# Patient Record
Sex: Male | Born: 1995 | Race: White | Hispanic: No | Smoking: Former smoker
Health system: Southern US, Community
[De-identification: ages and names within clinical notes are randomized; demographics above are authoritative.]

## PROBLEM LIST (undated history)

## (undated) DIAGNOSIS — R51 Headache: Secondary | ICD-10-CM

## (undated) DIAGNOSIS — M549 Dorsalgia, unspecified: Secondary | ICD-10-CM

## (undated) HISTORY — DX: Headache: R51

---
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
## 2015-06-10 ENCOUNTER — Emergency Department (INDEPENDENT_AMBULATORY_CARE_PROVIDER_SITE_OTHER): Payer: BLUE CROSS/BLUE SHIELD

## 2015-06-10 ENCOUNTER — Emergency Department (HOSPITAL_COMMUNITY)
Admission: EM | Admit: 2015-06-10 | Discharge: 2015-06-10 | Disposition: A | Discharge status: Home / Self Care | Payer: BLUE CROSS/BLUE SHIELD | Source: Home / Self Care | Attending: Family Medicine | Admitting: Family Medicine

## 2015-06-10 DIAGNOSIS — M533 Sacrococcygeal disorders, not elsewhere classified: Secondary | ICD-10-CM | POA: Diagnosis not present

## 2015-06-10 MED ORDER — OXYCODONE-ACETAMINOPHEN 5-325 MG PO TABS: 1.0000 | ORAL_TABLET | ORAL | Status: DC | PRN

## 2015-06-10 MED ORDER — IBUPROFEN 800 MG PO TABS
ORAL_TABLET | ORAL | Status: AC
  Filled 2015-06-10: qty 1

## 2015-06-10 MED ORDER — IBUPROFEN 800 MG PO TABS
400.0000 mg | ORAL_TABLET | Freq: Once | ORAL | Status: AC
  Administered 2015-06-10: 400 mg via ORAL

## 2015-06-10 MED ORDER — KETOROLAC TROMETHAMINE 30 MG/ML IJ SOLN: 30.0000 mg | Freq: Once | INTRAMUSCULAR | Status: DC

## 2015-06-10 NOTE — Discharge Instructions (Signed)
Your xray is negative for fracture. We will treat you conservatively. Please use pain medicine as needed. Obtain donut cushion from the pharmacy to put pressure off your tail bone. See your PCP in 1 wk or sooner if no improvement.  Tailbone Injury The tailbone is the small bone at the lower end of the backbone (spine). You may have stretched tissues, bruises, or a broken bone (fracture). These injuries can be painful. Most tailbone injuries get better on their own in 4-6 weeks. HOME CARE  Take medicines only as told by your doctor.  If told, apply ice to the injured area.  Put ice in a plastic bag.  Place a towel between your skin and the bag.  Leave the ice on for 20 minutes, 2-3 times per day. Do this for the first 1-2 days.  Sit on a large, rubber or inflated ring or cushion to lessen pain. Lean forward when you sit to help lessen pain.  Avoid sitting in one place for a long time.  Increase your activity as the pain allows.  Do exercises as told by your doctor or physical therapist.  If it is painful to poop, take medicine to help you poop (stool softeners) as told by your doctor.  Eat foods that have plenty of fiber.  Keep all follow-up visits as told by your doctor. This is important. GET HELP IF:  Your pain gets worse.  Pooping causes you pain.  You cannot poop (constipation).  You are leaking pee (urinary incontinence).  You have a fever.   This information is not intended to replace advice given to you by your health care provider. Make sure you discuss any questions you have with your health care provider.   Document Released: 06/14/2010 Document Revised: 09/26/2014 Document Reviewed: 05/08/2014 Elsevier Interactive Patient Education [employer]2016 [employer]

## 2015-06-10 NOTE — ED Provider Notes (Signed)
CSN: [id number]     Arrival date & time 06/10/15  1749 History   First MD Initiated Contact with Patient 06/10/15 1858     No chief complaint on file.  (Consider location/radiation/quality/duration/timing/severity/associated sxs/prior Treatment) The history is provided by the patient. No language interpreter was used.   tail bone pain: Patient presents with tail bone pain following a fall off the truck and the ball that connects the trailer to the truck hit him on his butt.  This happened 2 days ago.Since then he has been in pain. He can't sit straight up. Pain is about 8/10 standing and more when sitting down. Now he has some swelling of his tail bone. He used Ibuprofen as needed for pain with no improvement. Her grandma gave her some pain meds which helped yesterday.  Past Medical History  Diagnosis Date  . DUKGURKY(706.2Headache(784.0)    Past Surgical History  Procedure Laterality Date  . Tympanostomy tube placement Bilateral 1998   Family History  Problem Relation Age of Onset  . Migraines Mother   . Seizures Mother   . Heart attack Maternal Grandfather     Died at 347  . Heart attack Paternal Grandfather     Died at 877   Social History  Substance Use Topics  . Smoking status: Never Smoker   . Smokeless tobacco: Never Used  . Alcohol Use: No    Review of Systems  Respiratory: Negative.   Cardiovascular: Negative.   Gastrointestinal: Negative.   Musculoskeletal:       Tail bone pain  All other systems reviewed and are negative.   Allergies  Review of patient's allergies indicates no known allergies.  Home Medications   Prior to Admission medications   Medication Sig Start Date End Date Taking? Authorizing Provider  albuterol (PROVENTIL HFA;VENTOLIN HFA) 108 (90 BASE) MCG/ACT inhaler Inhale 1 puff into the lungs every 6 (six) hours as needed for wheezing or shortness of breath.    Historical Provider, MD  levocetirizine (XYZAL) 5 MG tablet  04/04/14   Historical Provider, MD    ondansetron (ZOFRAN) 4 MG tablet Take 1 tablet (4 mg total) by mouth every 8 (eight) hours as needed for nausea or vomiting. 04/30/14   Ladona MowJoe Mintz, PA-C   Meds Ordered and Administered this Visit  Medications - No data to display  BP 114/75 mmHg  Pulse 91  Temp(Src) 98.3 F (36.8 C) (Oral)  SpO2 99% No data found.   Physical Exam  Constitutional: He appears well-developed. No distress.  Cardiovascular: Regular rhythm and normal heart sounds.   No murmur heard. Pulmonary/Chest: Effort normal and breath sounds normal. No respiratory distress. He has no wheezes.  Skin:     Nursing note and vitals reviewed.   ED Course  Procedures (including critical care time)  Labs Review Labs Reviewed - No data to display  Imaging Review No results found.   Visual Acuity Review  Right Eye Distance:   Left Eye Distance:   Bilateral Distance:    Right Eye Near:   Left Eye Near:    Bilateral Near:     Dg Sacrum/coccyx  06/10/2015  CLINICAL DATA:  Pain following fall from truck EXAM: SACRUM AND COCCYX - 2+ VIEW COMPARISON:  None. FINDINGS: Frontal and lateral views were obtained. There is no demonstrable fracture or diastases. Joint spaces appear intact. No erosive change. IMPRESSION: No fracture or diastases.  No appreciable arthropathic change. Electronically Signed   By: Bretta BangWilliam  Woodruff III M.D.   On:  06/10/2015 19:29       MDM  No diagnosis found. Coccydynia  Concern for coccygeal fracture. Xray image reviewed by me and radiologist's impression reviewed. No fracture or dislocation noted. Ibuprofen given during this visit with mild improvement of his pain. i recommended OTC donut cushion to help put weight off his tail bone. Percocet prescribed prn pain. F/U with PCP in 1 wk or sooner if pain persist.    Doreene Eland, MD 06/10/15 [phone]

## 2015-06-10 NOTE — ED Notes (Signed)
Patient states he fell off the back of a truck two days ago hitting His tail bone the hitch of the truck Has a lot of pain and can also feel a "knot" where he landed

## 2015-12-01 ENCOUNTER — Emergency Department (HOSPITAL_COMMUNITY)
Admission: EM | Admit: 2015-12-01 | Discharge: 2015-12-01 | Disposition: A | Discharge status: Home / Self Care | Payer: BLUE CROSS/BLUE SHIELD | Attending: Emergency Medicine | Admitting: Emergency Medicine

## 2015-12-01 ENCOUNTER — Encounter (HOSPITAL_COMMUNITY): Payer: Self-pay | Admitting: Emergency Medicine

## 2015-12-01 DIAGNOSIS — M549 Dorsalgia, unspecified: Secondary | ICD-10-CM | POA: Insufficient documentation

## 2015-12-01 DIAGNOSIS — R197 Diarrhea, unspecified: Secondary | ICD-10-CM | POA: Insufficient documentation

## 2015-12-01 DIAGNOSIS — R112 Nausea with vomiting, unspecified: Secondary | ICD-10-CM

## 2015-12-01 DIAGNOSIS — F17210 Nicotine dependence, cigarettes, uncomplicated: Secondary | ICD-10-CM | POA: Diagnosis not present

## 2015-12-01 HISTORY — DX: Dorsalgia, unspecified: M54.9

## 2015-12-01 LAB — COMPREHENSIVE METABOLIC PANEL
ALBUMIN: 4.6 g/dL (ref 3.5–5.0)
ALT: 20 U/L (ref 17–63)
ANION GAP: 10 (ref 5–15)
AST: 23 U/L (ref 15–41)
Alkaline Phosphatase: 75 U/L (ref 38–126)
BUN: 12 mg/dL (ref 6–20)
CALCIUM: 9.7 mg/dL (ref 8.9–10.3)
CO2: 22 mmol/L (ref 22–32)
Chloride: 106 mmol/L (ref 101–111)
Creatinine, Ser: 0.75 mg/dL (ref 0.61–1.24)
GFR calc Af Amer: >60 mL/min (ref >60)
GFR calc non Af Amer: >60 mL/min (ref >60)
GLUCOSE: 124 mg/dL — AB (ref 65–99)
Potassium: 4 mmol/L (ref 3.5–5.1)
SODIUM: 138 mmol/L (ref 135–145)
Total Bilirubin: 0.7 mg/dL (ref 0.3–1.2)
Total Protein: 7.8 g/dL (ref 6.5–8.1)

## 2015-12-01 LAB — CBC
HCT: 50 % (ref 39.0–52.0)
HEMOGLOBIN: 17.2 g/dL — AB (ref 13.0–17.0)
MCH: 30.9 pg (ref 26.0–34.0)
MCHC: 34.4 g/dL (ref 30.0–36.0)
MCV: 89.9 fL (ref 78.0–100.0)
Platelets: 278 10*3/uL (ref 150–400)
RBC: 5.56 MIL/uL (ref 4.22–5.81)
RDW: 12.7 % (ref 11.5–15.5)
WBC: 9.6 10*3/uL (ref 4.0–10.5)

## 2015-12-01 LAB — URINE MICROSCOPIC-ADD ON
RBC / HPF: NONE SEEN RBC/hpf (ref 0–5)
WBC, UA: NONE SEEN WBC/hpf (ref 0–5)

## 2015-12-01 LAB — URINALYSIS, ROUTINE W REFLEX MICROSCOPIC
GLUCOSE, UA: NEGATIVE mg/dL
HGB URINE DIPSTICK: NEGATIVE
Ketones, ur: 40 mg/dL — AB
Leukocytes, UA: NEGATIVE
Nitrite: NEGATIVE
PH: 5.5 (ref 5.0–8.0)
PROTEIN: NEGATIVE mg/dL
SPECIFIC GRAVITY, URINE: 1.033 — AB (ref 1.005–1.030)

## 2015-12-01 LAB — LIPASE, BLOOD: Lipase: 16 U/L (ref 11–51)

## 2015-12-01 MED ORDER — ONDANSETRON 4 MG PO TBDP: 4.0000 mg | ORAL_TABLET | Freq: Three times a day (TID) | ORAL | Status: AC | PRN

## 2015-12-01 MED ORDER — LOPERAMIDE HCL 2 MG PO CAPS
4.0000 mg | ORAL_CAPSULE | Freq: Once | ORAL | Status: AC
  Administered 2015-12-01: 4 mg via ORAL
  Filled 2015-12-01: qty 2

## 2015-12-01 MED ORDER — IBUPROFEN 200 MG PO TABS
600.0000 mg | ORAL_TABLET | Freq: Once | ORAL | Status: AC
  Administered 2015-12-01: 600 mg via ORAL
  Filled 2015-12-01: qty 3

## 2015-12-01 MED ORDER — ONDANSETRON HCL 4 MG/2ML IJ SOLN
4.0000 mg | Freq: Once | INTRAMUSCULAR | Status: AC
  Administered 2015-12-01: 4 mg via INTRAVENOUS
  Filled 2015-12-01: qty 2

## 2015-12-01 MED ORDER — LOPERAMIDE HCL 2 MG PO CAPS: 2.0000 mg | ORAL_CAPSULE | ORAL | Status: AC | PRN

## 2015-12-01 MED ORDER — SODIUM CHLORIDE 0.9 % IV BOLUS (SEPSIS)
1000.0000 mL | Freq: Once | INTRAVENOUS | Status: AC
  Administered 2015-12-01: 1000 mL via INTRAVENOUS

## 2015-12-01 NOTE — ED Provider Notes (Signed)
CSN: [id number]     Arrival date & time 12/01/15  [id number] History   First MD Initiated Contact with Patient 12/01/15 0440     Chief Complaint  Patient presents with  . Back Pain  . Emesis  . Diarrhea     (Consider location/radiation/quality/duration/timing/severity/associated sxs/prior Treatment) HPI  This is a 20 year old male who presents with a one-day history of nausea, vomiting, and diarrhea. He reports symptoms since 7 PM. He reports nonbilious, nonbloody emesis and nonbloody diarrhea. Feels he may be dehydrated. He reports crampy abdominal pain with stooling. No known sick contacts. Denies fevers. Does endorse back pain. Back pain is nonlateralizing. Currently pain is 9 out of 10. He took some over-the-counter medication at home without relief of his symptoms.  Past Medical History  Diagnosis Date  . Headache(784.0)   . Back pain    Past Surgical History  Procedure Laterality Date  . Tympanostomy tube placement Bilateral 1998   Family History  Problem Relation Age of Onset  . Migraines Mother   . Seizures Mother   . Heart attack Maternal Grandfather     Died at 6947  . Heart attack Paternal Grandfather     Died at 8377   Social History  Substance Use Topics  . Smoking status: Current Every Day Smoker    Types: Cigarettes  . Smokeless tobacco: Never Used  . Alcohol Use: No    Review of Systems  Constitutional: Negative for fever.  Respiratory: Negative for shortness of breath.   Cardiovascular: Negative for chest pain.  Gastrointestinal: Positive for nausea, vomiting, abdominal pain and diarrhea.  Genitourinary: Negative for dysuria.  Musculoskeletal: Positive for back pain.  All other systems reviewed and are negative.     Allergies  Review of patient's allergies indicates no known allergies.  Home Medications   Prior to Admission medications   Medication Sig Start Date End Date Taking? Authorizing Provider  anti-nausea (EMETROL) solution Take 10 mLs by  mouth every 15 (fifteen) minutes as needed for nausea or vomiting.   Yes Historical Provider, MD  fexofenadine-pseudoephedrine (ALLEGRA-D 24) 180-240 MG 24 hr tablet Take 1 tablet by mouth daily.   Yes Historical Provider, MD  loperamide (IMODIUM) 2 MG capsule Take 1 capsule (2 mg total) by mouth as needed for diarrhea or loose stools. 12/01/15   Shon Batonourtney F Beverely Suen, MD  ondansetron (ZOFRAN ODT) 4 MG disintegrating tablet Take 1 tablet (4 mg total) by mouth every 8 (eight) hours as needed for nausea or vomiting. 12/01/15   Shon Batonourtney F Smita Lesh, MD   BP 125/76 mmHg  Pulse 81  Temp(Src) 98.1 F (36.7 C) (Oral)  Resp 15  SpO2 98% Physical Exam  Constitutional: He is oriented to person, place, and time. He appears well-developed and well-nourished. No distress.  HENT:  Head: Normocephalic and atraumatic.  Cardiovascular: Normal rate, regular rhythm and normal heart sounds.   No murmur heard. Pulmonary/Chest: Effort normal and breath sounds normal. No respiratory distress. He has no wheezes.  Abdominal: Soft. Bowel sounds are normal. There is tenderness. There is no rebound and no guarding.  Diffuse tenderness without rebound or guarding  Musculoskeletal: He exhibits no edema.  Neurological: He is alert and oriented to person, place, and time.  Skin: Skin is warm and dry.  Psychiatric: He has a normal mood and affect.  Nursing note and vitals reviewed.   ED Course  Procedures (including critical care time) Labs Review Labs Reviewed  COMPREHENSIVE METABOLIC PANEL - Abnormal; Notable for the following:  Glucose, Bld 124 (*)    All other components within normal limits  CBC - Abnormal; Notable for the following:    Hemoglobin 17.2 (*)    All other components within normal limits  URINALYSIS, ROUTINE W REFLEX MICROSCOPIC (NOT AT [facility]) - Abnormal; Notable for the following:    Color, Urine AMBER (*)    APPearance TURBID (*)    Specific Gravity, Urine 1.033 (*)    Bilirubin Urine SMALL (*)     Ketones, ur 40 (*)    All other components within normal limits  URINE MICROSCOPIC-ADD ON - Abnormal; Notable for the following:    Squamous Epithelial / LPF 0-5 (*)    Bacteria, UA FEW (*)    All other components within normal limits  LIPASE, BLOOD    Imaging Review No results found. I have personally reviewed and evaluated these images and lab results as part of my medical decision-making.   EKG Interpretation None      MDM   Final diagnoses:  Nausea vomiting and diarrhea  Bilateral back pain, unspecified location    Patient presents with nausea, vomiting, diarrhea, crampy abdominal pain and back pain. Nontoxic on exam. No significant signs of dehydration. Vital signs reassuring. Tender without localizing abdominal tenderness on exam. No signs of peritonitis. Workup is largely reassuring. 40 ketones in the urine likely representative of mild dehydration. Patient was given fluids, nausea location and diarrhea medication. On recheck he is able to tolerate fluids without difficulty. He reports improvement of the nausea. He continues to endorse back pain. History of chronic back pain. Will discharge him with Zofran, Imodium, and ibuprofen.  After history, exam, and medical workup I feel the patient has been appropriately medically screened and is safe for discharge home. Pertinent diagnoses were discussed with the patient. Patient was given return precautions.   Shon Baton, MD 12/01/15 [phone]

## 2015-12-01 NOTE — ED Notes (Signed)
C/o lower back pain, nausea, vomiting, and diarrhea since 7pm.  Reports 12 episodes of vomiting and diarrhea.  Decreased urination today.

## 2015-12-01 NOTE — Discharge Instructions (Signed)

## 2016-04-01 DIAGNOSIS — F411 Generalized anxiety disorder: Secondary | ICD-10-CM | POA: Diagnosis not present

## 2016-04-01 DIAGNOSIS — F17210 Nicotine dependence, cigarettes, uncomplicated: Secondary | ICD-10-CM | POA: Diagnosis not present

## 2016-04-01 DIAGNOSIS — J069 Acute upper respiratory infection, unspecified: Secondary | ICD-10-CM | POA: Diagnosis not present

## 2016-06-26 DIAGNOSIS — L309 Dermatitis, unspecified: Secondary | ICD-10-CM | POA: Diagnosis not present

## 2016-06-26 DIAGNOSIS — R51 Headache: Secondary | ICD-10-CM | POA: Diagnosis not present

## 2016-06-26 DIAGNOSIS — J0100 Acute maxillary sinusitis, unspecified: Secondary | ICD-10-CM | POA: Diagnosis not present

## 2016-11-14 DIAGNOSIS — R5383 Other fatigue: Secondary | ICD-10-CM | POA: Diagnosis not present

## 2016-11-14 DIAGNOSIS — R440 Auditory hallucinations: Secondary | ICD-10-CM | POA: Diagnosis not present

## 2016-11-28 DIAGNOSIS — F411 Generalized anxiety disorder: Secondary | ICD-10-CM | POA: Diagnosis not present

## 2016-11-28 DIAGNOSIS — F060 Psychotic disorder with hallucinations due to known physiological condition: Secondary | ICD-10-CM | POA: Diagnosis not present

## 2016-11-28 DIAGNOSIS — F331 Major depressive disorder, recurrent, moderate: Secondary | ICD-10-CM | POA: Diagnosis not present

## 2016-12-11 DIAGNOSIS — F331 Major depressive disorder, recurrent, moderate: Secondary | ICD-10-CM | POA: Diagnosis not present

## 2016-12-11 DIAGNOSIS — F060 Psychotic disorder with hallucinations due to known physiological condition: Secondary | ICD-10-CM | POA: Diagnosis not present

## 2016-12-11 DIAGNOSIS — F411 Generalized anxiety disorder: Secondary | ICD-10-CM | POA: Diagnosis not present

## 2017-02-09 DIAGNOSIS — F411 Generalized anxiety disorder: Secondary | ICD-10-CM | POA: Diagnosis not present

## 2017-02-09 DIAGNOSIS — F331 Major depressive disorder, recurrent, moderate: Secondary | ICD-10-CM | POA: Diagnosis not present

## 2017-02-09 DIAGNOSIS — F060 Psychotic disorder with hallucinations due to known physiological condition: Secondary | ICD-10-CM | POA: Diagnosis not present

## 2017-02-13 DIAGNOSIS — S060X9A Concussion with loss of consciousness of unspecified duration, initial encounter: Secondary | ICD-10-CM | POA: Diagnosis not present

## 2017-02-13 DIAGNOSIS — R569 Unspecified convulsions: Secondary | ICD-10-CM | POA: Diagnosis not present

## 2017-02-13 DIAGNOSIS — R51 Headache: Secondary | ICD-10-CM | POA: Diagnosis not present

## 2017-02-13 DIAGNOSIS — R440 Auditory hallucinations: Secondary | ICD-10-CM | POA: Diagnosis not present

## 2018-05-12 DIAGNOSIS — J069 Acute upper respiratory infection, unspecified: Secondary | ICD-10-CM | POA: Diagnosis not present

## 2018-05-12 DIAGNOSIS — J029 Acute pharyngitis, unspecified: Secondary | ICD-10-CM | POA: Diagnosis not present

## 2018-05-22 ENCOUNTER — Ambulatory Visit (HOSPITAL_COMMUNITY)
Admission: RE | Admit: 2018-05-22 | Discharge: 2018-05-22 | Disposition: A | Discharge status: Home / Self Care | Payer: Federal, State, Local not specified - Other | Attending: Psychiatry | Admitting: Psychiatry

## 2018-05-22 DIAGNOSIS — F332 Major depressive disorder, recurrent severe without psychotic features: Secondary | ICD-10-CM | POA: Insufficient documentation

## 2018-05-22 DIAGNOSIS — F1120 Opioid dependence, uncomplicated: Secondary | ICD-10-CM | POA: Insufficient documentation

## 2018-05-22 DIAGNOSIS — F16983 Hallucinogen use, unspecified with hallucinogen persisting perception disorder (flashbacks): Secondary | ICD-10-CM | POA: Diagnosis not present

## 2018-05-22 DIAGNOSIS — F1320 Sedative, hypnotic or anxiolytic dependence, uncomplicated: Secondary | ICD-10-CM | POA: Diagnosis not present

## 2018-05-22 NOTE — BH Assessment (Signed)
Assessment Note  Sean Boyer is an 22 y.o. male [facility]. Patient current denied suicidal ideations but report he was suicidal 4926-month ago. Denied homicidal ideations and denied auditory / visual hallucinations. Patient history of depression related to death of his dad at 89 year old via overdose and the death of is brother in 2011 whom was killed in [place]. Report when his dad overdose he was left alone with a dead body for 2-days. Patient brother raised him after the death of there father. Report current depressive feelings triggered by the recent breakup of his girlfriend. Patient report poly-substance use of opiates, hallucinogen, THC and xanax. Patient present with a pleasant attitude, clear tone and speech. Patient denied feelings of paranoid, current suicidal ideations, denied homicidal ideations, denied visual / auditory hallucinations. Has experienced auditory /visual hallucinations in the past related to substance use.   Disposition: Sean Mornharles Kober, PA, report patient does not meet inpatient criteria.     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 Back pain   . ZOXWRUEA(540.9Headache(784.0)     Past Surgical History:  Procedure Laterality Date  . TYMPANOSTOMY TUBE PLACEMENT Bilateral 1998    Family History:  Family History  Problem Relation Age of Onset  . Migraines Mother   . Seizures Mother   . Heart attack Maternal Grandfather        Died at 6847  . Heart attack Paternal Grandfather        Died at 3677    Social History:  reports that he has been smoking cigarettes. He has never used smokeless tobacco. He reports that he does not drink alcohol or use drugs.  Additional Social History:  Alcohol /  Drug Use Pain Medications: see MAR Prescriptions: see MAR Over the Counter: see MAR History of alcohol / drug use?: Yes Substance #1 Name of Substance 1: Opiates ( oxycodone, hydrocodone)  1 - Age of First Use: 14 1 - Amount (size/oz): unknown  1 - Frequency: unknown  1 - Duration: ongoing  1 - Last Use / Amount: 05/22/2018 Substance #2 Name of Substance 2: Hallucinogen  2 - Age of First Use: 14 2 - Amount (size/oz): unknown  2 - Frequency: unknown  2 - Duration: ongoing 2 - Last Use / Amount: 02/2018 Substance #3 Name of Substance 3: Xanax  3 - Age of First Use: 16 3 - Amount (size/oz): unknown  3 - Frequency: unknown  3 - Duration: unknown  3 - Last Use / Amount: 6 months  Substance #4 Name of Substance 4: THC 4 - Age of First Use: 14 4 - Amount (size/oz): unknown  4 - Frequency: unknown 4 - Duration: unknown 4 - Last Use / Amount: 3 nights ago (05/19/2018)  CIWA:   COWS:    Allergies: No Known Allergies  Home Medications: (Not in a hospital admission)   OB/GYN Status:  No LMP for male patient.  General Assessment Data [facility] Assessment[facility]) TTS Assessment: In system Is this a Tele or Face-to-Face Assessment?: Face-to-Face Is this an Initial Assessment or a Re-assessment for this encounter?: Initial Assessment Patient Accompanied by:: N/A Language Other than English: No What gender do you identify as?: Male Living Arrangements: Other  relatives Can pt return to current living arrangement?: Yes Admission Status: Voluntary Is patient capable of signing voluntary admission?: Yes Referral Source: Self/Family/Friend     Crisis Care Plan Living Arrangements: Other relatives Legal Guardian: Other:(self) Name of Psychiatrist: denied  Name of Therapist: denied  Education Status Is patient currently in school?: No Is the patient employed, unemployed or receiving disability?: Unemployed  Risk to self with the past 6  months Suicidal Ideation: No-Not Currently/Within Last 6 Months(report suicidal 53-month ago ) Has patient been a risk to self within the past 6 months prior to admission? : No Suicidal Intent: No Has patient had any suicidal intent within the past 6 months prior to admission? : No Is patient at risk for suicide?: No Suicidal Plan?: No Has patient had any suicidal plan within the past 6 months prior to admission? : No Access to Means: No What has been your use of drugs/alcohol within the last 12 months?: opiates, xanax, hallucinogen (LSD, mushrooms)  Previous Attempts/Gestures: No How many times?: 0 Other Self Harm Risks: poly substance abuser  Triggers for Past Attempts: None known Intentional Self Injurious Behavior: None Family Suicide History: Unknown Recent stressful life event(s): Other (Comment)(breakup with girlfriend ) Persecutory voices/beliefs?: No Depression: Yes Depression Symptoms: Insomnia, Loss of interest in usual pleasures, Feeling worthless/self pity Substance abuse history and/or treatment for substance abuse?: No Suicide prevention information given to non-admitted patients: Not applicable  Risk to Others within the past 6 months Homicidal Ideation: No Does patient have any lifetime risk of violence toward others beyond the six months prior to admission? : No Thoughts of Harm to Others: No Current Homicidal Intent: No Current Homicidal Plan: No Access to Homicidal Means: No Identified Victim: none known  History of harm to others?: No Assessment of Violence: None Noted Violent Behavior Description: None Noted  Does patient have access to weapons?: No Criminal Charges Pending?: No Does patient have a court date: No Is patient on probation?: No  Psychosis Hallucinations: None noted Delusions: None noted  Mental Status Report Appearance/Hygiene: Other (Comment)(dressed appropriate for weather ) Eye Contact: Good Motor Activity: Freedom of  movement Speech: Logical/coherent Level of Consciousness: Alert Mood: Depressed Affect: Depressed Anxiety Level: None Thought Processes: Coherent, Relevant Judgement: Unimpaired Orientation: Person, Place, Time, Situation Obsessive Compulsive Thoughts/Behaviors: None  Cognitive Functioning Concentration: Normal Memory: Recent Intact, Remote Intact Is patient IDD: No Insight: Poor Impulse Control: Fair Appetite: Poor Have you had any weight changes? : No Change Sleep: Decreased Total Hours of Sleep: (report - 2 hrs sleep per night ) Vegetative Symptoms: None  ADLScreening [facility] Assessment Services) Patient's cognitive ability adequate to safely complete daily activities?: Yes Patient able to express need for assistance with ADLs?: Yes Independently performs ADLs?: Yes (appropriate for developmental age)  Prior Inpatient Therapy Prior Inpatient Therapy: No  Prior Outpatient Therapy Prior Outpatient Therapy: No Does patient have an ACCT team?: No Does patient have Intensive In-House Services?  : No Does patient have [facility] services? : No Does patient have P4CC services?: No  ADL Screening (condition at time of admission) Patient's cognitive ability adequate to safely complete daily activities?: Yes Is the patient deaf or have difficulty hearing?: No Does the patient have difficulty seeing, even when wearing glasses/contacts?: No Does the patient have difficulty concentrating, remembering, or making decisions?: No Patient able to express need for assistance with ADLs?: Yes Does the patient have difficulty dressing or bathing?: No Independently performs ADLs?: Yes (appropriate for developmental age) Does the patient have  difficulty walking or climbing stairs?: No       Abuse/Neglect Assessment (Assessment to be complete while patient is alone) Abuse/Neglect Assessment Can Be Completed: Yes Physical Abuse: Denies Verbal Abuse: Denies Sexual Abuse:  Denies Exploitation of patient/patient's resources: Denies Self-Neglect: Denies     Merchant navy officerAdvance Directives (For Healthcare) Does Patient Have a Medical Advance Directive?: No Would patient like information on creating a medical advance directive?: No - Patient declined          Disposition:  Disposition Initial Assessment Completed for this Encounter: Yes(Sean Eloisa NorthernKober, PA, recommend outpatient tx ) Disposition of Patient: Discharge(Sean Boyer, [place], pt did not meet criteria for inpatient )  On Site Evaluation by:   Reviewed with Physician:    Sean Boyer 05/22/2018 6:31 PM

## 2018-05-22 NOTE — H&P (Signed)
Behavioral Health Medical Screening Exam  [place] Boy[place] is an 22 y.o. male.seeking treatment for opiate dependence-wanting Lifestyle plan ACE inhibitor therapy was not prescribed due to Not. Currently suicidal.Had thought about going to Sean Boyer first but came here. Says he has BC/BS  Total Time spent with patient: 20 minutes  Psychiatric Specialty Exam: Physical Exam  Vitals reviewed. Constitutional: He is oriented to person, place, and time. He appears well-developed and well-nourished. He appears distressed.  HENT:  Head: Normocephalic and atraumatic.  Right Ear: External ear normal.  Left Ear: External ear normal.  Nose: Nose normal.  Eyes: Pupils are equal, round, and reactive to light. Conjunctivae and EOM are normal. Right eye exhibits no discharge. Left eye exhibits no discharge. No scleral icterus.  Neck: Normal range of motion. Neck supple. No JVD present. No tracheal deviation present. No thyromegaly present.  Cardiovascular: Normal rate and regular rhythm.  Respiratory: Effort normal. No stridor. No respiratory distress. He has no wheezes.  GI:  Inspection normal  Genitourinary:    Genitourinary Comments: Deferred   Musculoskeletal: Normal range of motion.        General: No tenderness, deformity or edema.  Neurological: He is alert and oriented to person, place, and time. No cranial nerve deficit. Coordination normal.  Skin: Skin is warm and dry. No rash noted.  Psychiatric:  See MSE    Review of Systems  Constitutional: Positive for chills (opiate W/D). Negative for diaphoresis, fever, malaise/fatigue and weight loss.  HENT: Negative for congestion, hearing loss, nosebleeds, sinus pain, sore throat and tinnitus.   Eyes: Negative for blurred vision, double vision, photophobia, pain, discharge and redness.  Respiratory: Negative for cough, hemoptysis, sputum production, shortness of breath, wheezing and stridor.   Cardiovascular: Positive for chest pain.  Negative for palpitations, orthopnea, claudication, leg swelling and PND.  Gastrointestinal: Positive for abdominal pain, constipation, diarrhea and nausea. Negative for blood in stool, heartburn, melena and vomiting.  Genitourinary: Negative for dysuria, flank pain, frequency, hematuria and urgency.  Musculoskeletal: Positive for back pain, joint pain, myalgias and neck pain.  Skin: Negative for itching and rash.  Neurological: Negative for dizziness, tingling, tremors, sensory change, speech change, focal weakness, seizures, loss of consciousness, weakness and headaches.  Endo/Heme/Allergies: Negative for environmental allergies and polydipsia. Does not bruise/bleed easily.  Psychiatric/Behavioral: Positive for depression, hallucinations, memory loss, substance abuse and suicidal ideas. The patient is nervous/anxious and has insomnia.     T 97.6 P76 B/P 117/70 r-18 PAO2 100%  General Appearance: Casual and Well Groomed  Eye Contact:  tearful at times  Speech:  Clear and Coherent  Volume:  Normal  Mood:  Dysphoric  Affect:  Congruent  Thought Process:  Coherent, Goal Directed and Descriptions of Associations: Intact  Orientation:  Full (Time, Place, and Person)  Thought Content:  WDL, Logical and Rumination  Suicidal Thoughts:  No  Homicidal Thoughts:  No  Memory:  Negative  Judgement:  Negative  Insight:  Present  Psychomotor Activity:  Decreased  Concentration: Concentration: Good and Attention Span: Good  Recall:  Good  Fund of Knowledge:WDL  Language: WDL  Akathisia:  NA  Handed:  Right  AIMS (if indicated):   NA  Assets:  Communication Skills Desire for Improvement Financial Resources/Insurance Physical Health Resilience Talents/Skills Transportation Vocational/Educational  Sleep:       Musculoskeletal: Strength & Muscle Tone: within normal limits Gait & Station: normal Patient leans: N/A  Recommendations:  Based on my evaluation the patient does not appear to  have an emergency medical condition.  Sean hall contacted-pt can go tomorrow for assessment.Contracts for safety  Sean MornCharles Navaya Wiatrek, PA-C 05/22/2018, 6:26 PM

## 2018-06-07 DIAGNOSIS — F1110 Opioid abuse, uncomplicated: Secondary | ICD-10-CM | POA: Diagnosis not present

## 2018-06-07 DIAGNOSIS — F411 Generalized anxiety disorder: Secondary | ICD-10-CM | POA: Diagnosis not present

## 2018-06-07 DIAGNOSIS — F319 Bipolar disorder, unspecified: Secondary | ICD-10-CM | POA: Diagnosis not present

## 2018-06-07 DIAGNOSIS — F17200 Nicotine dependence, unspecified, uncomplicated: Secondary | ICD-10-CM | POA: Diagnosis not present

## 2018-06-16 DIAGNOSIS — R45851 Suicidal ideations: Secondary | ICD-10-CM | POA: Diagnosis not present

## 2018-06-16 DIAGNOSIS — F250 Schizoaffective disorder, bipolar type: Secondary | ICD-10-CM | POA: Diagnosis not present

## 2018-06-16 DIAGNOSIS — F17210 Nicotine dependence, cigarettes, uncomplicated: Secondary | ICD-10-CM | POA: Diagnosis not present

## 2018-06-16 DIAGNOSIS — F3163 Bipolar disorder, current episode mixed, severe, without psychotic features: Secondary | ICD-10-CM | POA: Diagnosis not present

## 2018-06-16 DIAGNOSIS — S60511A Abrasion of right hand, initial encounter: Secondary | ICD-10-CM | POA: Diagnosis not present

## 2018-06-18 ENCOUNTER — Encounter (HOSPITAL_COMMUNITY): Payer: Self-pay

## 2018-06-18 ENCOUNTER — Ambulatory Visit (HOSPITAL_COMMUNITY)
Admission: EM | Admit: 2018-06-18 | Discharge: 2018-06-18 | Disposition: A | Discharge status: Home / Self Care | Payer: BLUE CROSS/BLUE SHIELD | Attending: Internal Medicine | Admitting: Internal Medicine

## 2018-06-18 DIAGNOSIS — J189 Pneumonia, unspecified organism: Secondary | ICD-10-CM

## 2018-06-18 DIAGNOSIS — J181 Lobar pneumonia, unspecified organism: Secondary | ICD-10-CM | POA: Diagnosis not present

## 2018-06-18 MED ORDER — PREDNISONE 10 MG PO TABS: 20.0000 mg | ORAL_TABLET | Freq: Every day | ORAL | 0 refills | Status: AC

## 2018-06-18 MED ORDER — AMOXICILLIN-POT CLAVULANATE 875-125 MG PO TABS: 1.0000 | ORAL_TABLET | Freq: Two times a day (BID) | ORAL | 0 refills | Status: AC

## 2018-06-18 MED ORDER — HYDROCOD POLST-CPM POLST ER 10-8 MG/5ML PO SUER: 5.0000 mL | Freq: Every evening | ORAL | 0 refills | Status: AC | PRN

## 2018-06-18 MED ORDER — ALBUTEROL SULFATE (2.5 MG/3ML) 0.083% IN NEBU
2.5000 mg | INHALATION_SOLUTION | RESPIRATORY_TRACT | Status: AC
  Administered 2018-06-18: 2.5 mg via RESPIRATORY_TRACT

## 2018-06-18 MED ORDER — ALBUTEROL SULFATE (2.5 MG/3ML) 0.083% IN NEBU
INHALATION_SOLUTION | RESPIRATORY_TRACT | Status: AC
  Filled 2018-06-18: qty 3

## 2018-06-18 NOTE — ED Triage Notes (Signed)
Pt present coughing, vomiting and body aches. Symptoms started last Sunday.  Pt has tried OTC medication with no relief

## 2018-06-18 NOTE — ED Provider Notes (Signed)
[facility]    CSN: [id number] Arrival date & time: 06/18/18  [id number]     History   Chief Complaint Chief Complaint  Patient presents with  . Generalized Body Aches  . Emesis  . Cough    HPI [place] Boy[place] is a 23 y.o. male.   23 year old male with history of migraine headache presents to urgent care complaining of body aches, cough, shortness of breath, nausea and vomiting x1 week.  The week preceding the onset of symptoms the patient thought he had a sinus infection.  He seems to get better when the aforementioned symptoms began a short time later.  The patient admits to feeling feverish but has not had a temperature greater than 100.  He admits that his stool has been somewhat loose almost like mucus but nonbloody, non-foul-smelling.     Past Medical History:  Diagnosis Date  . Back pain   . [id number](956.2Headache(784.0)     Patient Active Problem List   Diagnosis Date Noted  . Episodic tension type headache 03/30/2013  . Migraine without aura, without mention of intractable migraine without mention of status migrainosus 03/30/2013    Past Surgical History:  Procedure Laterality Date  . TYMPANOSTOMY TUBE PLACEMENT Bilateral 1998       Home Medications    Prior to Admission medications   Medication Sig Start Date End Date Taking? Authorizing Provider  amoxicillin-clavulanate (AUGMENTIN) 875-125 MG tablet Take 1 tablet by mouth every 12 (twelve) hours. 06/18/18   Arnaldo Nataliamond, Pasqualina Colasurdo S, MD  anti-nausea (EMETROL) solution Take 10 mLs by mouth every 15 (fifteen) minutes as needed for nausea or vomiting.    [provider]  chlorpheniramine-HYDROcodone (TUSSIONEX PENNKINETIC ER) 10-8 MG/5ML SUER Take 5 mLs by mouth at bedtime as needed for up to 14 days for cough. 06/18/18 07/02/18  Arnaldo Nataliamond, Victor Langenbach S, MD  fexofenadine-pseudoephedrine (ALLEGRA-D 24) 180-240 MG 24 hr tablet Take 1 tablet by mouth daily.    [provider]  loperamide (IMODIUM) 2 MG capsule  Take 1 capsule (2 mg total) by mouth as needed for diarrhea or loose stools. 12/01/15   Horton, Mayer Maskerourtney F, MD  ondansetron (ZOFRAN ODT) 4 MG disintegrating tablet Take 1 tablet (4 mg total) by mouth every 8 (eight) hours as needed for nausea or vomiting. 12/01/15   Horton, Mayer Maskerourtney F, MD  predniSONE (DELTASONE) 10 MG tablet Take 2 tablets (20 mg total) by mouth daily for 4 days. 06/18/18 06/22/18  Arnaldo Nataliamond, Yarlin Breisch S, MD    Family History Family History  Problem Relation Age of Onset  . Migraines Mother   . Seizures Mother   . Heart attack Maternal Grandfather        Died at 7247  . Heart attack Paternal Grandfather        Died at 4977    Social History Social History   Tobacco Use  . Smoking status: Current Every Day Smoker    Types: Cigarettes  . Smokeless tobacco: Never Used  Substance Use Topics  . Alcohol use: No  . Drug use: No     Allergies   Patient has no known allergies.   Review of Systems Review of Systems  Constitutional: Negative for chills and fever.  HENT: Negative for sore throat and tinnitus.   Eyes: Negative for redness.  Respiratory: Positive for cough and shortness of breath.   Cardiovascular: Negative for chest pain and palpitations.  Gastrointestinal: Negative for abdominal pain, diarrhea, nausea and vomiting.  Genitourinary: Negative for dysuria, frequency and  urgency.  Musculoskeletal: Negative for myalgias.  Skin: Negative for rash.       No lesions  Neurological: Negative for weakness.  Hematological: Does not bruise/bleed easily.  Psychiatric/Behavioral: Negative for suicidal ideas.     Physical Exam Triage Vital Signs ED Triage Vitals  Enc Vitals Group     BP 06/18/18 1919 127/67     Pulse Rate 06/18/18 1919 94     Resp 06/18/18 1919 16     Temp 06/18/18 1919 97.8 F (36.6 C)     Temp Source 06/18/18 1919 Oral     SpO2 06/18/18 1919 99 %     Weight --      Height --      Head Circumference --      Peak Flow --      Pain Score  06/18/18 1918 9     Pain Loc --      Pain Edu? --      Excl. in GC? --    No data found.  Updated Vital Signs BP 127/67 (BP Location: Right Arm)   Pulse 94   Temp 97.8 F (36.6 C) (Oral)   Resp 16   SpO2 99%   Visual Acuity Right Eye Distance:   Left Eye Distance:   Bilateral Distance:    Right Eye Near:   Left Eye Near:    Bilateral Near:     Physical Exam Constitutional:      General: He is not in acute distress.    Appearance: He is well-developed.  HENT:     Head: Normocephalic and atraumatic.  Eyes:     General: No scleral icterus.    Conjunctiva/sclera: Conjunctivae normal.     Pupils: Pupils are equal, round, and reactive to light.  Neck:     Musculoskeletal: Normal range of motion and neck supple.     Thyroid: No thyromegaly.     Vascular: No JVD.     Trachea: No tracheal deviation.  Cardiovascular:     Rate and Rhythm: Normal rate and regular rhythm.     Heart sounds: Normal heart sounds. No murmur. No friction rub. No gallop.   Pulmonary:     Effort: Pulmonary effort is normal. No respiratory distress.     Breath sounds: Wheezing and rhonchi present.  Abdominal:     General: Bowel sounds are normal. There is no distension.     Palpations: Abdomen is soft.     Tenderness: There is no abdominal tenderness.  Musculoskeletal: Normal range of motion.  Lymphadenopathy:     Cervical: No cervical adenopathy.  Skin:    General: Skin is warm and dry.     Findings: No erythema or rash.  Neurological:     Mental Status: He is alert and oriented to person, place, and time.     Cranial Nerves: No cranial nerve deficit.  Psychiatric:        Behavior: Behavior normal.        Thought Content: Thought content normal.        Judgment: Judgment normal.      UC Treatments / Results  Labs (all labs ordered are listed, but only abnormal results are displayed) Labs Reviewed - No data to display  EKG None  Radiology No results  found.  Procedures Procedures (including critical care time)  Medications Ordered in UC Medications  albuterol (PROVENTIL) (2.5 MG/3ML) 0.083% nebulizer solution 2.5 mg (2.5 mg Nebulization Given 06/18/18 1935)    Initial Impression / Assessment and  Plan / UC Course  I have reviewed the triage vital signs and the nursing notes.  Pertinent labs & imaging results that were available during my care of the patient were reviewed by me and considered in my medical decision making (see chart for details).     Symptoms consistent with pneumonia.  Respiratory effort improved after albuterol neb.  The patient has a nebulizer machine at home.  Encouraged to use for symptomatic relief.  Prescribed antibiotics and prednisone as well as cough suppressant.  Clinically no sepsis.  Final Clinical Impressions(s) / UC Diagnoses   Final diagnoses:  Community acquired pneumonia of right upper lobe of lung [facility])   Discharge Instructions   None    ED Prescriptions    Medication Sig Dispense Auth. Provider   amoxicillin-clavulanate (AUGMENTIN) 875-125 MG tablet Take 1 tablet by mouth every 12 (twelve) hours. 14 tablet Arnaldo Natal, MD   predniSONE (DELTASONE) 10 MG tablet Take 2 tablets (20 mg total) by mouth daily for 4 days. 8 tablet Arnaldo Natal, MD   chlorpheniramine-HYDROcodone [facility] ER) 10-8 MG/5ML SUER Take 5 mLs by mouth at bedtime as needed for up to 14 days for cough. 70 mL Arnaldo Natal, MD     Controlled Substance Prescriptions [place] Controlled Substance Registry consulted? Not Applicable   Arnaldo Natal, MD 06/18/18 2009

## 2018-06-30 DIAGNOSIS — F259 Schizoaffective disorder, unspecified: Secondary | ICD-10-CM | POA: Diagnosis not present

## 2018-06-30 DIAGNOSIS — F4481 Dissociative identity disorder: Secondary | ICD-10-CM | POA: Diagnosis not present

## 2018-06-30 DIAGNOSIS — F411 Generalized anxiety disorder: Secondary | ICD-10-CM | POA: Diagnosis not present

## 2018-07-22 DIAGNOSIS — F411 Generalized anxiety disorder: Secondary | ICD-10-CM | POA: Diagnosis not present

## 2018-07-22 DIAGNOSIS — F250 Schizoaffective disorder, bipolar type: Secondary | ICD-10-CM | POA: Diagnosis not present

## 2018-09-02 DIAGNOSIS — F411 Generalized anxiety disorder: Secondary | ICD-10-CM | POA: Diagnosis not present

## 2018-09-02 DIAGNOSIS — F250 Schizoaffective disorder, bipolar type: Secondary | ICD-10-CM | POA: Diagnosis not present

## 2018-09-16 DIAGNOSIS — F411 Generalized anxiety disorder: Secondary | ICD-10-CM | POA: Diagnosis not present

## 2018-09-16 DIAGNOSIS — F250 Schizoaffective disorder, bipolar type: Secondary | ICD-10-CM | POA: Diagnosis not present

## 2018-10-08 DIAGNOSIS — F411 Generalized anxiety disorder: Secondary | ICD-10-CM | POA: Diagnosis not present

## 2018-10-08 DIAGNOSIS — F250 Schizoaffective disorder, bipolar type: Secondary | ICD-10-CM | POA: Diagnosis not present

## 2018-10-08 DIAGNOSIS — F900 Attention-deficit hyperactivity disorder, predominantly inattentive type: Secondary | ICD-10-CM | POA: Diagnosis not present

## 2018-11-05 DIAGNOSIS — F411 Generalized anxiety disorder: Secondary | ICD-10-CM | POA: Diagnosis not present

## 2018-11-05 DIAGNOSIS — F900 Attention-deficit hyperactivity disorder, predominantly inattentive type: Secondary | ICD-10-CM | POA: Diagnosis not present

## 2018-11-05 DIAGNOSIS — F250 Schizoaffective disorder, bipolar type: Secondary | ICD-10-CM | POA: Diagnosis not present

## 2020-02-05 ENCOUNTER — Other Ambulatory Visit: Payer: Self-pay

## 2020-02-05 ENCOUNTER — Encounter (HOSPITAL_COMMUNITY): Payer: Self-pay | Admitting: Emergency Medicine

## 2020-02-05 ENCOUNTER — Emergency Department (HOSPITAL_COMMUNITY): Payer: Medicaid Other

## 2020-02-05 ENCOUNTER — Emergency Department (HOSPITAL_COMMUNITY)
Admission: EM | Admit: 2020-02-05 | Discharge: 2020-02-05 | Disposition: A | Discharge status: Home / Self Care | Payer: Medicaid Other | Attending: Emergency Medicine | Admitting: Emergency Medicine

## 2020-02-05 DIAGNOSIS — W228XXA Striking against or struck by other objects, initial encounter: Secondary | ICD-10-CM | POA: Insufficient documentation

## 2020-02-05 DIAGNOSIS — Y9389 Activity, other specified: Secondary | ICD-10-CM | POA: Diagnosis not present

## 2020-02-05 DIAGNOSIS — Y999 Unspecified external cause status: Secondary | ICD-10-CM | POA: Insufficient documentation

## 2020-02-05 DIAGNOSIS — Y9289 Other specified places as the place of occurrence of the external cause: Secondary | ICD-10-CM | POA: Diagnosis not present

## 2020-02-05 DIAGNOSIS — Z79899 Other long term (current) drug therapy: Secondary | ICD-10-CM | POA: Diagnosis not present

## 2020-02-05 DIAGNOSIS — S62316A Displaced fracture of base of fifth metacarpal bone, right hand, initial encounter for closed fracture: Secondary | ICD-10-CM | POA: Insufficient documentation

## 2020-02-05 DIAGNOSIS — F17210 Nicotine dependence, cigarettes, uncomplicated: Secondary | ICD-10-CM | POA: Insufficient documentation

## 2020-02-05 DIAGNOSIS — S6991XA Unspecified injury of right wrist, hand and finger(s), initial encounter: Secondary | ICD-10-CM | POA: Diagnosis present

## 2020-02-05 DIAGNOSIS — S62339A Displaced fracture of neck of unspecified metacarpal bone, initial encounter for closed fracture: Secondary | ICD-10-CM

## 2020-02-05 MED ORDER — HYDROCODONE-ACETAMINOPHEN 5-325 MG PO TABS: 1.0000 | ORAL_TABLET | ORAL | 0 refills | Status: AC | PRN

## 2020-02-05 MED ORDER — HYDROCODONE-ACETAMINOPHEN 5-325 MG PO TABS
1.0000 | ORAL_TABLET | Freq: Once | ORAL | Status: AC
  Administered 2020-02-05: 1 via ORAL
  Filled 2020-02-05: qty 1

## 2020-02-05 MED ORDER — HYDROCODONE-ACETAMINOPHEN 5-325 MG PO TABS: 1.0000 | ORAL_TABLET | ORAL | 0 refills | Status: DC | PRN

## 2020-02-05 NOTE — ED Notes (Signed)
Paged ortho tech 

## 2020-02-05 NOTE — ED Triage Notes (Signed)
C/o R hand pain and swelling. Punched a wall earlier today.

## 2020-02-05 NOTE — ED Provider Notes (Addendum)
[facility] EMERGENCY DEPARTMENT Provider Note   CSN: [id number] Arrival date & time: 02/05/20  1727     History Chief Complaint  Patient presents with   Hand Pain    Sean Boyer is a 24 y.o. male with past medical history significant for migraine who presents for evaluation of hand pain.  Patient states yesterday he punched a wall.  Felt immediate pain to ulnar aspect of hand.  Had some swelling.  He feels like "I broke it."  Rates his pain a 9/10.  Denies fever, chills, nausea, vomiting, paresthesias, redness, warmth, lacerations or contusions.  No decreased range of motion to digits, wrist.  No pain to radius or ulna.  Denies aggravating or alleviating factors.  He is not followed by hand surgery orthopedics  History obtained from patient and past medical records.  No interpreter used.  HPI     Past Medical History:  Diagnosis Date   Back pain    Headache(784.0)     Patient Active Problem List   Diagnosis Date Noted   Episodic tension type headache 03/30/2013   Migraine without aura, without mention of intractable migraine without mention of status migrainosus 03/30/2013    Past Surgical History:  Procedure Laterality Date   TYMPANOSTOMY TUBE PLACEMENT Bilateral 1998       Family History  Problem Relation Age of Onset   Migraines Mother    Seizures Mother    Heart attack Maternal Grandfather        Died at 79   Heart attack Paternal Grandfather        Died at 26    Social History   Tobacco Use   Smoking status: Current Every Day Smoker    Types: Cigarettes   Smokeless tobacco: Never Used  Building services engineer Use: Every day  Substance Use Topics   Alcohol use: No   Drug use: No    Home Medications Prior to Admission medications   Medication Sig Start Date End Date Taking? Authorizing Provider  amoxicillin-clavulanate (AUGMENTIN) 875-125 MG tablet Take 1 tablet by mouth every 12 (twelve) hours. 06/18/18    Arnaldo Natal, MD  anti-nausea (EMETROL) solution Take 10 mLs by mouth every 15 (fifteen) minutes as needed for nausea or vomiting.    [provider]  fexofenadine-pseudoephedrine (ALLEGRA-D 24) 180-240 MG 24 hr tablet Take 1 tablet by mouth daily.    [provider]  HYDROcodone-acetaminophen (NORCO/VICODIN) 5-325 MG tablet Take 1 tablet by mouth every 4 (four) hours as needed. 02/05/20   Ova Meegan A, PA-C  loperamide (IMODIUM) 2 MG capsule Take 1 capsule (2 mg total) by mouth as needed for diarrhea or loose stools. 12/01/15   Horton, Mayer Masker, MD  ondansetron (ZOFRAN ODT) 4 MG disintegrating tablet Take 1 tablet (4 mg total) by mouth every 8 (eight) hours as needed for nausea or vomiting. 12/01/15   Horton, Mayer Masker, MD    Allergies    Patient has no known allergies.  Review of Systems   Review of Systems  Constitutional: Negative.   HENT: Negative.   Respiratory: Negative.   Cardiovascular: Negative.   Gastrointestinal: Negative.   Genitourinary: Negative.   Musculoskeletal:       Right hand pain  Skin: Negative.   Neurological: Negative.   All other systems reviewed and are negative.   Physical Exam Updated Vital Signs BP 129/89    Pulse 68    Temp 98.7 F (37.1 C) (Oral)  Resp 16    Ht 5\' 5"  (1.651 m)    Wt 61.2 kg    SpO2 99%    BMI 22.47 kg/m   Physical Exam Vitals and nursing note reviewed.  Constitutional:      General: He is not in acute distress.    Appearance: He is well-developed. He is not diaphoretic.  HENT:     Head: Atraumatic.  Eyes:     Pupils: Pupils are equal, round, and reactive to light.  Cardiovascular:     Rate and Rhythm: Normal rate and regular rhythm.  Pulmonary:     Effort: Pulmonary effort is normal. No respiratory distress.  Abdominal:     General: There is no distension.     Palpations: Abdomen is soft.  Musculoskeletal:        General: Normal range of motion.     Right hand: Swelling, tenderness and  bony tenderness present. No deformity or lacerations. Normal range of motion. Normal strength. Normal sensation. There is no disruption of two-point discrimination. Normal capillary refill. Normal pulse.     Left hand: Normal.       Hands:     Cervical back: Normal range of motion and neck supple.     Comments: Soft tissue swelling and tenderness over fifth metacarpal right upper extremity.  He is able to wiggle fingers without difficulty.  Full range of motion with pronation, supination, flexion extension to bilateral wrist.  Nontender radius and ulna.  Nontender digits bilaterally.  Skin:    General: Skin is warm and dry.     Capillary Refill: Capillary refill takes less than 2 seconds.     Comments: Soft tissue swelling to volar aspect right hand.  Does not extend into wrist or digits.  No contusions or abrasions.  No lacerations.  No overlying erythema or warmth.  No fluctuance or induration  Neurological:     General: No focal deficit present.     Mental Status: He is alert.     Sensory: Sensation is intact.     Motor: No tremor or abnormal muscle tone.     Comments: 4/5 handgrip to right hand secondary to pain, 5/5 handgrip to left.  Intact sensation to bilateral upper extremities.  Compartments soft     ED Results / Procedures / Treatments   Labs (all labs ordered are listed, but only abnormal results are displayed) Labs Reviewed - No data to display  EKG None  Radiology DG Hand Complete Right  Result Date: 02/05/2020 CLINICAL DATA:  Initial evaluation for acute pain. EXAM: RIGHT HAND - COMPLETE 3+ VIEW COMPARISON:  None. FINDINGS: Acute oblique fracture through the distal right fifth metacarpal head with volar angulation. No intra-articular extension. Overlying soft tissue swelling. No other acute osseous abnormality. IMPRESSION: Acute oblique fracture through the distal right fifth metacarpal head with volar angulation. Electronically Signed   By: Rise MuBenjamin  McClintock M.D.    On: 02/05/2020 18:38    Procedures Reduction of fracture  Date/Time: 02/05/2020 10:25 PM Performed by: Linwood DibblesHenderly, Sarya Linenberger A, PA-C Authorized by: Linwood DibblesHenderly, Jadarion Halbig A, PA-C  Preparation: Patient was prepped and draped in the usual sterile fashion. Local anesthesia used: no  Anesthesia: Local anesthesia used: no  Sedation: Patient sedated: no  Patient tolerance: patient tolerated the procedure well with no immediate complications  .Splint Application  Date/Time: 02/05/2020 10:25 PM Performed by: Linwood DibblesHenderly, Keishawn Darsey A, PA-C Authorized by: Linwood DibblesHenderly, Amar Sippel A, PA-C   Consent:    Consent obtained:  Verbal   Consent given  by:  Patient   Risks discussed:  Discoloration, numbness, pain and swelling   Alternatives discussed:  Referral, observation, alternative treatment, delayed treatment and no treatment Pre-procedure details:    Sensation:  Normal Procedure details:    Laterality:  Right   Location:  Hand   Hand:  R hand   Strapping: no     Cast type:  Short arm   Splint type:  Ulnar gutter   Supplies:  Sling, elastic bandage, Ortho-Glass and cotton padding Post-procedure details:    Pain:  Improved   Sensation:  Normal   Patient tolerance of procedure:  Tolerated well, no immediate complications   (including critical care time)  Medications Ordered in ED Medications  HYDROcodone-acetaminophen (NORCO/VICODIN) 5-325 MG per tablet 1 tablet (1 tablet Oral Given 02/05/20 2147)    ED Course  I have reviewed the triage vital signs and the nursing notes.  Pertinent labs & imaging results that were available during my care of the patient were reviewed by me and considered in my medical decision making (see chart for details).  24 year old appears otherwise well presents for evaluation of right hand pain after punching wall yesterday.  He is afebrile, nonseptic, non-ill-appearing.  Patient neurovascularly intact.  Tenderness over ulnar aspect fifth digit right upper extremity. Soft  tissue swelling.  No lacerations to suture.  Compartments soft.  No erythema, warmth, fluctuance or induration.  Plain film right hand with metacarpal fracture to fifth digit on right upper extremity with volar angulation.  We will provide pain management, attempt reduction, splint.  Patient will need to follow-up outpatient with hand surgery  Splint placed.  Neurovascularly intact after splint.  Patient did not want post reduction films.  Would like to be discharged.  Will follow outpatient with hand surgery.  Pain medication provided.  RICE for symptomatic management.  The patient has been appropriately medically screened and/or stabilized in the ED. I have low suspicion for any other emergent medical condition which would require further screening, evaluation or treatment in the ED or require inpatient management.  Patient is hemodynamically stable and in no acute distress.  Patient able to ambulate in department prior to ED.  Evaluation does not show acute pathology that would require ongoing or additional emergent interventions while in the emergency department or further inpatient treatment.  I have discussed the diagnosis with the patient and answered all questions.  Pain is been managed while in the emergency department and patient has no further complaints prior to discharge.  Patient is comfortable with plan discussed in room and is stable for discharge at this time.  I have discussed strict return precautions for returning to the emergency department.  Patient was encouraged to follow-up with PCP/specialist refer to at discharge.  ADDEND: RX sent to wrong pharmacy. Called by nursing and cancelled original rx. Sent to correct pharmacy    MDM Rules/Calculators/A&P                          Final Clinical Impression(s) / ED Diagnoses Final diagnoses:  Closed boxer's fracture, initial encounter    Rx / DC Orders ED Discharge Orders         Ordered    HYDROcodone-acetaminophen  (NORCO/VICODIN) 5-325 MG tablet  Every 4 hours PRN        02/05/20 2226           Jamiyah Dingley A, PA-C 02/05/20 2227    Jacalyn Lefevre, MD 02/05/20 2242  Yasmene Salomone A, PA-C 02/05/20 2257    Jacalyn Lefevre, MD 02/05/20 2324

## 2020-02-05 NOTE — Discharge Instructions (Signed)
Make sure to ice and elevate your arm.  You may take ibuprofen.  I have given you a short course of pain medicine.  Do not drive or operate heavy machinery with this.  You will need to follow-up outpatient with hand surgery.  Return for any worsening symptoms

## 2020-02-05 NOTE — Progress Notes (Signed)
Orthopedic Tech Progress Note Patient Details:  Sean Boyer [facility] 21-Apr-1996 [id number]  Ortho Devices Type of Ortho Device: Arm sling, Ulna gutter splint Ortho Device/Splint Location: rue. pt had pain during splint application. Ortho Device/Splint Interventions: Ordered, Application, Adjustment   Post Interventions Patient Tolerated: Well Instructions Provided: Care of device, Adjustment of device   [name] 02/05/2020, 10:16 PM

## 2020-02-06 ENCOUNTER — Telehealth: Payer: Self-pay | Admitting: *Deleted

## 2020-02-06 NOTE — Telephone Encounter (Signed)
Pt girlfriend called regarding Rx sent to wrong pharmacy.  RNCM sees that Rx was sent to [facility] on [address]. Return phone number was unrecognizable, RNCM was unable to return call.

## 2021-10-02 ENCOUNTER — Emergency Department (HOSPITAL_COMMUNITY): Payer: Medicaid Other

## 2021-10-02 ENCOUNTER — Other Ambulatory Visit: Payer: Self-pay

## 2021-10-02 ENCOUNTER — Encounter (HOSPITAL_COMMUNITY): Payer: Self-pay

## 2021-10-02 ENCOUNTER — Emergency Department (HOSPITAL_COMMUNITY)
Admission: EM | Admit: 2021-10-02 | Discharge: 2021-10-02 | Disposition: A | Discharge status: Home / Self Care | Payer: Medicaid Other | Attending: Emergency Medicine | Admitting: Emergency Medicine

## 2021-10-02 DIAGNOSIS — X509XXA Other and unspecified overexertion or strenuous movements or postures, initial encounter: Secondary | ICD-10-CM | POA: Insufficient documentation

## 2021-10-02 DIAGNOSIS — M25511 Pain in right shoulder: Secondary | ICD-10-CM | POA: Diagnosis present

## 2021-10-02 MED ORDER — KETOROLAC TROMETHAMINE 30 MG/ML IJ SOLN
30.0000 mg | Freq: Once | INTRAMUSCULAR | Status: AC
  Administered 2021-10-02: 30 mg via INTRAMUSCULAR
  Filled 2021-10-02: qty 1

## 2021-10-02 NOTE — ED Provider Notes (Signed)
?[facility]-EMERGENCY DEPT ?Provider Note ? ? ?CSN: [id number] ?Arrival date & time: 10/02/21  1854 ? ?  ? ?History ? ?Chief Complaint  ?Patient presents with  ? Shoulder Injury  ? ? ?[place] Suzie Portela Costanzo is a 26 y.o. male. ? ?26 y.o male with no PMH presents to the ED with a chief complaint of right shoulder pain x 4-5 hours.  Patient reports he was out in the yard, slinging brakes when suddenly he felt one of them catching his finger and felt his right shoulder pull out of place.  He reports his friend was able to "pop this back in place ".  He reports he feels the gravity keeps pushing down on his shoulder, making it worse with any movement.  He does have a prior history of a torn rotator cuff which she received physical therapy for in the past.  He currently does not follow-up with any orthopedist, did not take any medication for improvement in his symptoms.  No alleviating factors aside from rest.  No other injury reported. ? ?The history is provided by the patient and medical records.  ?Shoulder Injury ?This is a new problem. The current episode started 3 to 5 hours ago. The problem occurs constantly. Pertinent negatives include no shortness of breath.  ? ?  ? ?Home Medications ?Prior to Admission medications   ?Medication Sig Start Date End Date Taking? Authorizing Provider  ?amoxicillin-clavulanate (AUGMENTIN) 875-125 MG tablet Take 1 tablet by mouth every 12 (twelve) hours. 06/18/18   Arnaldo Natal, MD  ?anti-nausea (EMETROL) solution Take 10 mLs by mouth every 15 (fifteen) minutes as needed for nausea or vomiting.    [provider]  ?fexofenadine-pseudoephedrine (ALLEGRA-D 24) 180-240 MG 24 hr tablet Take 1 tablet by mouth daily.    [provider]  ?HYDROcodone-acetaminophen (NORCO/VICODIN) 5-325 MG tablet Take 1 tablet by mouth every 4 (four) hours as needed. 02/05/20   Henderly, Britni A, PA-C  ?loperamide (IMODIUM) 2 MG capsule Take 1 capsule (2 mg total) by mouth  as needed for diarrhea or loose stools. 12/01/15   Horton, Mayer Masker, MD  ?ondansetron (ZOFRAN ODT) 4 MG disintegrating tablet Take 1 tablet (4 mg total) by mouth every 8 (eight) hours as needed for nausea or vomiting. 12/01/15   Horton, Mayer Masker, MD  ?   ? ?Allergies    ?Patient has no known allergies.   ? ?Review of Systems   ?Review of Systems  ?Constitutional:  Negative for fever.  ?Respiratory:  Negative for shortness of breath.   ?Musculoskeletal:  Positive for arthralgias.  ? ?Physical Exam ?Updated Vital Signs ?BP 125/89 (BP Location: Left Arm)   Pulse 73   Temp 99 ?F (37.2 ?C) (Oral)   Resp 15   Ht 5\' 6"  (1.676 m)   Wt 60.1 kg   SpO2 100%   BMI 21.37 kg/m?  ?Physical Exam ?Vitals and nursing note reviewed.  ?Constitutional:   ?   Appearance: Normal appearance.  ?HENT:  ?   Head: Normocephalic and atraumatic.  ?   Mouth/Throat:  ?   Mouth: Mucous membranes are moist.  ?Eyes:  ?   Pupils: Pupils are equal, round, and reactive to light.  ?Cardiovascular:  ?   Rate and Rhythm: Normal rate.  ?Pulmonary:  ?   Effort: Pulmonary effort is normal.  ?Abdominal:  ?   General: Abdomen is flat.  ?Musculoskeletal:     ?   General: Tenderness present.  ?   Right shoulder:  Tenderness present. No bony tenderness or crepitus. Normal range of motion. Normal strength. Normal pulse.  ?   Left shoulder: No swelling, deformity, tenderness, bony tenderness or crepitus. Normal range of motion. Normal strength. Normal pulse.  ?   Cervical back: Normal range of motion and neck supple.  ?   Comments: Decrease ROM with full extension of the right shoulder, there is a clunking sensation on the way down.  However patient able to over reach to the opposite shoulder.  Pulses are intact, decreased strength with pulling.  Sensation is intact throughout.  ?Neurological:  ?   Mental Status: He is alert and oriented to person, place, and time.  ? ? ?ED Results / Procedures / Treatments   ?Labs ?(all labs ordered are listed, but only  abnormal results are displayed) ?Labs Reviewed - No data to display ? ?EKG ?None ? ?Radiology ?DG Shoulder Right ? ?Result Date: 10/02/2021 ?CLINICAL DATA:  Transient right shoulder dislocation EXAM: RIGHT SHOULDER - 2+ VIEW COMPARISON:  None Available. FINDINGS: Internal rotation, external rotation, and transscapular views are obtained. No acute displaced fracture, subluxation, or dislocation. Joint spaces are well preserved. Right chest is clear. IMPRESSION: 1. Unremarkable right shoulder. Electronically Signed   By: Sharlet Salina M.D.   On: 10/02/2021 20:15   ? ?Procedures ?Procedures  ? ? ?Medications Ordered in ED ?Medications  ?ketorolac (TORADOL) 30 MG/ML injection 30 mg (30 mg Intramuscular Given 10/02/21 2030)  ? ? ?ED Course/ Medical Decision Making/ A&P ?  ?                        ?Medical Decision Making ?Amount and/or Complexity of Data Reviewed ?Radiology: ordered. ? ?Risk ?Prescription drug management. ? ? ?Patient here with right shoulder pain status post swinging cylinders out in his yard.  Reports pain with any movement, did not take any medication for improvement in symptoms.  Felt like this had popped out of place however now popped back in. ? ?During evaluation he is overall nontoxic-appearing, there is pain with ranging of the right shoulder, there is a clunking catching sensation on the way down he is able to fully extend the right arm above his head some problems.  Pulses are present, sensation is intact, he is neurovascularly intact. ? ?X-ray did not show any acute findings such as fracture, dislocation.  Have some suspicion for subluxation, patient treated conservatively with NSAIDs in the ED, sling to the right arm along with outpatient follow-up 2 to 3 days.  Patient understands and agrees to management, patient stable for discharge ? ? ? ? ?Portions of this note were generated with Scientist, clinical (histocompatibility and immunogenetics). Dictation errors may occur despite best attempts at proofreading.   ?Final  Clinical Impression(s) / ED Diagnoses ?Final diagnoses:  ?Acute pain of right shoulder  ? ? ?Rx / DC Orders ?ED Discharge Orders   ? ? None  ? ?  ? ? ?  ?Claude Manges, PA-C ?10/02/21 2032 ? ?  ?Bethann Berkshire, MD ?10/04/21 1131 ? ?

## 2021-10-02 NOTE — Discharge Instructions (Addendum)
Your x-ray on today's visit did not show any acute findings. ? ?You received a sling while in the ED, please keep this in place until you follow-up with orthopedics. ? ?You may take some over-the-counter Aleve to help with your symptoms, make sure you take this medication with food. ?

## 2021-10-02 NOTE — ED Triage Notes (Signed)
Pt states that he popped his right shoulder out of place 4-5 hrs ago. He had someone pop it back in but it has been hurting ever since. Pt states that he tore the rotator cuff on that side before and had surgery.  ?

## 2021-10-02 NOTE — ED Notes (Signed)
I provided reinforced discharge education based off of discharge instructions. Pt acknowledged and understood my education. Pt had no further questions/concerns for provider/myself.  °

## 2022-01-02 ENCOUNTER — Emergency Department (HOSPITAL_COMMUNITY)
Admission: EM | Admit: 2022-01-02 | Discharge: 2022-01-02 | Discharge status: Against Medical Advice | Payer: Medicaid Other | Attending: Emergency Medicine | Admitting: Emergency Medicine

## 2022-01-02 ENCOUNTER — Other Ambulatory Visit: Payer: Self-pay

## 2022-01-02 ENCOUNTER — Encounter (HOSPITAL_COMMUNITY): Payer: Self-pay

## 2022-01-02 ENCOUNTER — Emergency Department (HOSPITAL_COMMUNITY): Payer: Medicaid Other

## 2022-01-02 DIAGNOSIS — M542 Cervicalgia: Secondary | ICD-10-CM | POA: Diagnosis not present

## 2022-01-02 DIAGNOSIS — R1011 Right upper quadrant pain: Secondary | ICD-10-CM | POA: Diagnosis not present

## 2022-01-02 DIAGNOSIS — R110 Nausea: Secondary | ICD-10-CM | POA: Insufficient documentation

## 2022-01-02 DIAGNOSIS — Z5321 Procedure and treatment not carried out due to patient leaving prior to being seen by health care provider: Secondary | ICD-10-CM | POA: Insufficient documentation

## 2022-01-02 LAB — CBC
HCT: 47.2 % (ref 39.0–52.0)
Hemoglobin: 15.8 g/dL (ref 13.0–17.0)
MCH: 30 pg (ref 26.0–34.0)
MCHC: 33.5 g/dL (ref 30.0–36.0)
MCV: 89.7 fL (ref 80.0–100.0)
Platelets: 312 10*3/uL (ref 150–400)
RBC: 5.26 MIL/uL (ref 4.22–5.81)
RDW: 12.6 % (ref 11.5–15.5)
WBC: 4.9 10*3/uL (ref 4.0–10.5)
nRBC: 0 % (ref 0.0–0.2)

## 2022-01-02 LAB — URINALYSIS, ROUTINE W REFLEX MICROSCOPIC
Bilirubin Urine: NEGATIVE
Glucose, UA: NEGATIVE mg/dL
Hgb urine dipstick: NEGATIVE
Ketones, ur: NEGATIVE mg/dL
Leukocytes,Ua: NEGATIVE
Nitrite: NEGATIVE
Protein, ur: NEGATIVE mg/dL
Specific Gravity, Urine: 1.013 (ref 1.005–1.030)
pH: 8 (ref 5.0–8.0)

## 2022-01-02 LAB — COMPREHENSIVE METABOLIC PANEL
ALT: 23 U/L (ref 0–44)
AST: 21 U/L (ref 15–41)
Albumin: 4.6 g/dL (ref 3.5–5.0)
Alkaline Phosphatase: 57 U/L (ref 38–126)
Anion gap: 6 (ref 5–15)
BUN: 8 mg/dL (ref 6–20)
CO2: 26 mmol/L (ref 22–32)
Calcium: 9.2 mg/dL (ref 8.9–10.3)
Chloride: 106 mmol/L (ref 98–111)
Creatinine, Ser: 0.61 mg/dL (ref 0.61–1.24)
GFR, Estimated: >60 mL/min (ref >60)
Glucose, Bld: 98 mg/dL (ref 70–99)
Potassium: 3.8 mmol/L (ref 3.5–5.1)
Sodium: 138 mmol/L (ref 135–145)
Total Bilirubin: 0.5 mg/dL (ref 0.3–1.2)
Total Protein: 7.5 g/dL (ref 6.5–8.1)

## 2022-01-02 LAB — LIPASE, BLOOD: Lipase: 23 U/L (ref 11–51)

## 2022-01-02 NOTE — ED Notes (Signed)
Patient has a urine culture in the main lab 

## 2022-01-02 NOTE — ED Triage Notes (Signed)
Patient c/o right lateral neck pain that started this AM.  Patient c/o RUQ abdominal pain x 1 week. Patient also c/o nausea 2 days and states that it only happens when he is moving.

## 2022-01-02 NOTE — ED Provider Triage Note (Signed)
Emergency Medicine Provider Triage Evaluation Note  Sean Boyer , a 26 y.o. male  was evaluated in triage.  Pt complains of abd pain. RUQ tenderness and fullness x 1 week.  Some nausea . Some R side neck pain.  No fever, diarrhea, constipation, cp, cough, sob  Review of Systems  Positive: As above Negative: As above  Physical Exam  BP 137/77 (BP Location: Left Arm)   Pulse 72   Temp 98.7 F (37.1 C) (Oral)   Resp 18   Ht 5\' 6"  (1.676 m)   Wt 61.2 kg   SpO2 98%   BMI 21.79 kg/m  Gen:   Awake, no distress   Resp:  Normal effort  MSK:   Moves extremities without difficulty  Other:    Medical Decision Making  Medically screening exam initiated at 1:33 PM.  Appropriate orders placed.  [facility] was informed that the remainder of the evaluation will be completed by another provider, this initial triage assessment does not replace that evaluation, and the importance of remaining in the ED until their evaluation is complete.    [facility], PA-C 01/02/22 1334

## 2022-01-28 ENCOUNTER — Emergency Department (HOSPITAL_COMMUNITY)
Admission: EM | Admit: 2022-01-28 | Discharge: 2022-01-29 | Disposition: A | Discharge status: Home / Self Care | Payer: BC Managed Care – PPO | Attending: Student | Admitting: Student

## 2022-01-28 ENCOUNTER — Emergency Department (HOSPITAL_COMMUNITY): Payer: BC Managed Care – PPO

## 2022-01-28 ENCOUNTER — Other Ambulatory Visit: Payer: Self-pay

## 2022-01-28 ENCOUNTER — Encounter (HOSPITAL_COMMUNITY): Payer: Self-pay

## 2022-01-28 DIAGNOSIS — Z5321 Procedure and treatment not carried out due to patient leaving prior to being seen by health care provider: Secondary | ICD-10-CM | POA: Insufficient documentation

## 2022-01-28 DIAGNOSIS — R0789 Other chest pain: Secondary | ICD-10-CM | POA: Diagnosis present

## 2022-01-28 NOTE — ED Triage Notes (Signed)
Pt reports jumping onto the bed and hearing pop with pain to left side. Pt reports since having shob and lightheaded.

## 2022-01-28 NOTE — ED Provider Triage Note (Signed)
Emergency Medicine Provider Triage Evaluation Note  Sean Boyer , a 26 y.o. male  was evaluated in triage.  Pt complains of left rib pain. Jumped and heard a pop.   Review of Systems  Positive: Rib pain Negative: fever  Physical Exam  BP (!) 138/90 (BP Location: Right Arm)   Pulse 83   Temp 99.1 F (37.3 C) (Oral)   Resp 17   Ht 5\' 6"  (1.676 m)   Wt 61.2 kg   SpO2 98%   BMI 21.79 kg/m  Gen:   Awake, no distress   Resp:  Normal effort  MSK:   Moves extremities without difficulty  Other:    Medical Decision Making  Medically screening exam initiated at 11:27 PM.  Appropriate orders placed.  [facility] was informed that the remainder of the evaluation will be completed by another provider, this initial triage assessment does not replace that evaluation, and the importance of remaining in the ED until their evaluation is complete.  [facility], PA-C 01/28/22 2328

## 2022-07-19 ENCOUNTER — Encounter (HOSPITAL_BASED_OUTPATIENT_CLINIC_OR_DEPARTMENT_OTHER): Payer: Self-pay | Admitting: Emergency Medicine

## 2022-07-19 ENCOUNTER — Emergency Department (HOSPITAL_BASED_OUTPATIENT_CLINIC_OR_DEPARTMENT_OTHER)
Admission: EM | Admit: 2022-07-19 | Discharge: 2022-07-19 | Disposition: A | Discharge status: Home / Self Care | Payer: Medicaid Other | Attending: Emergency Medicine | Admitting: Emergency Medicine

## 2022-07-19 ENCOUNTER — Emergency Department (HOSPITAL_BASED_OUTPATIENT_CLINIC_OR_DEPARTMENT_OTHER): Payer: Medicaid Other

## 2022-07-19 DIAGNOSIS — R0789 Other chest pain: Secondary | ICD-10-CM | POA: Diagnosis not present

## 2022-07-19 DIAGNOSIS — M546 Pain in thoracic spine: Secondary | ICD-10-CM | POA: Diagnosis not present

## 2022-07-19 DIAGNOSIS — X509XXA Other and unspecified overexertion or strenuous movements or postures, initial encounter: Secondary | ICD-10-CM | POA: Diagnosis not present

## 2022-07-19 NOTE — ED Triage Notes (Signed)
Pt sts he felt a pop on LT side rib cage about 1 month ago; since then he has had persistent pain there and feels like it is hard to get a breath sometimes

## 2022-07-19 NOTE — ED Provider Notes (Signed)
[place] EMERGENCY DEPARTMENT AT [facility] Provider Note   CSN: [id number] Arrival date & time: 07/19/22  1851     History  Chief Complaint  Patient presents with   Chest Pain    [place] is a 27 y.o. male.  27 year old male presents with complaint of pain in his left mid clavicular lower ribs onset 3 weeks ago when he was lying in bed feeding his infant a bottle, went to scoot over in the bed and felt a pop in his ribs.  Reports intense pain in that moment, continues to have discomfort and she feels like he can palpate swelling in the area, not improving.  Denies shortness of breath.  States he also has some pain in his left side back, worse with palpation and movement.  No other complaints or concerns.  Occasionally vapes, no vomiting.       Home Medications Prior to Admission medications   Medication Sig Start Date End Date Taking? Authorizing Provider  amoxicillin-clavulanate (AUGMENTIN) 875-125 MG tablet Take 1 tablet by mouth every 12 (twelve) hours. 06/18/18   Harrie Foreman, MD  anti-nausea (EMETROL) solution Take 10 mLs by mouth every 15 (fifteen) minutes as needed for nausea or vomiting.    [provider]  fexofenadine-pseudoephedrine (ALLEGRA-D 24) 180-240 MG 24 hr tablet Take 1 tablet by mouth daily.    [provider]  HYDROcodone-acetaminophen (NORCO/VICODIN) 5-325 MG tablet Take 1 tablet by mouth every 4 (four) hours as needed. 02/05/20   Henderly, Britni A, PA-C  loperamide (IMODIUM) 2 MG capsule Take 1 capsule (2 mg total) by mouth as needed for diarrhea or loose stools. 12/01/15   Horton, Barbette Hair, MD  ondansetron (ZOFRAN ODT) 4 MG disintegrating tablet Take 1 tablet (4 mg total) by mouth every 8 (eight) hours as needed for nausea or vomiting. 12/01/15   Horton, Barbette Hair, MD      Allergies    Dm-doxylamine-acetaminophen and Olanzapine    Review of Systems   Review of Systems Negative except as per HPI Physical  Exam Updated Vital Signs BP 130/79 (BP Location: Right Arm)   Pulse 90   Temp 98.4 F (36.9 C) (Oral)   Resp 16   Ht '5\' 6"'$  (1.676 m)   Wt 63.5 kg   SpO2 98%   BMI 22.60 kg/m  Physical Exam Vitals and nursing note reviewed.  Constitutional:      General: He is not in acute distress.    Appearance: He is well-developed. He is not diaphoretic.  HENT:     Head: Normocephalic and atraumatic.  Pulmonary:     Effort: Pulmonary effort is normal.  Chest:     Chest wall: Tenderness present.       Comments: Focal area of tenderness to the left lower chest in the midclavicular line without visible skin changes, no appreciable swelling or asymmetry. Abdominal:     Palpations: Abdomen is soft.     Tenderness: There is no abdominal tenderness.  Musculoskeletal:     Cervical back: No tenderness or bony tenderness.     Thoracic back: Tenderness present. No bony tenderness.     Lumbar back: No tenderness or bony tenderness.       Back:  Skin:    General: Skin is warm and dry.     Findings: No erythema or rash.  Neurological:     Mental Status: He is alert and oriented to person, place, and time.  Psychiatric:  Behavior: Behavior normal.     ED Results / Procedures / Treatments   Labs (all labs ordered are listed, but only abnormal results are displayed) Labs Reviewed - No data to display  EKG None  Radiology DG Chest 2 View  Result Date: 07/19/2022 CLINICAL DATA:  Chest pain. Bent wrong over the bad and felt something pop in largest area. EXAM: CHEST - 2 VIEW COMPARISON:  Chest radiograph and left rib radiographs 01/28/2022 FINDINGS: Cardiac silhouette and mediastinal contours are within normal limits. The lungs are clear. No pleural effusion or pneumothorax. No acute skeletal abnormality. IMPRESSION: No active cardiopulmonary disease. Electronically Signed   By: Yvonne Kendall M.D.   On: 07/19/2022 19:25    Procedures Procedures    Medications Ordered in  ED Medications - No data to display  ED Course/ Medical Decision Making/ A&P                             Medical Decision Making Amount and/or Complexity of Data Reviewed Radiology: ordered.   27 year old male with complaint as above.  Found to have tenderness to the chest wall anteriorly without overlying changes.  Also palpation reproduces pain in the left side of his back without specific bony tenderness.  Chest x-ray is ordered in triage interpreted by myself as negative for acute findings, agree with radiologist interpretation.  Patient asks if he can apply IcyHot to area.  Advised this is probably fine, follow-up with PCP if pain continues.        Final Clinical Impression(s) / ED Diagnoses Final diagnoses:  Chest wall pain    Rx / DC Orders ED Discharge Orders     None         Roque Lias 07/19/22 2027    Gareth Morgan, MD 07/20/22 1304

## 2022-07-19 NOTE — Discharge Instructions (Signed)
Can apply topical medication as discussed.  Follow-up with your primary care provider if not improving.

## 2022-08-20 ENCOUNTER — Encounter (HOSPITAL_BASED_OUTPATIENT_CLINIC_OR_DEPARTMENT_OTHER): Payer: Self-pay | Admitting: Emergency Medicine

## 2022-08-20 ENCOUNTER — Other Ambulatory Visit: Payer: Self-pay

## 2022-08-20 ENCOUNTER — Emergency Department (HOSPITAL_BASED_OUTPATIENT_CLINIC_OR_DEPARTMENT_OTHER)
Admission: EM | Admit: 2022-08-20 | Discharge: 2022-08-20 | Disposition: A | Discharge status: Home / Self Care | Payer: Medicaid Other | Attending: Emergency Medicine | Admitting: Emergency Medicine

## 2022-08-20 DIAGNOSIS — T781XXA Other adverse food reactions, not elsewhere classified, initial encounter: Secondary | ICD-10-CM | POA: Insufficient documentation

## 2022-08-20 DIAGNOSIS — L509 Urticaria, unspecified: Secondary | ICD-10-CM | POA: Diagnosis not present

## 2022-08-20 DIAGNOSIS — T7840XA Allergy, unspecified, initial encounter: Secondary | ICD-10-CM

## 2022-08-20 MED ORDER — LORATADINE 10 MG PO TABS
10.0000 mg | ORAL_TABLET | Freq: Once | ORAL | Status: AC
  Administered 2022-08-20: 10 mg via ORAL
  Filled 2022-08-20: qty 1

## 2022-08-20 MED ORDER — PREDNISONE 50 MG PO TABS
60.0000 mg | ORAL_TABLET | Freq: Once | ORAL | Status: AC
  Administered 2022-08-20: 60 mg via ORAL
  Filled 2022-08-20: qty 1

## 2022-08-20 MED ORDER — EPINEPHRINE 0.3 MG/0.3ML IJ SOAJ: 0.3000 mg | INTRAMUSCULAR | 0 refills | Status: AC | PRN

## 2022-08-20 MED ORDER — FAMOTIDINE 20 MG PO TABS
40.0000 mg | ORAL_TABLET | Freq: Once | ORAL | Status: AC
  Administered 2022-08-20: 40 mg via ORAL
  Filled 2022-08-20: qty 2

## 2022-08-20 NOTE — ED Triage Notes (Signed)
Pt w/ lip swelling that started last night; feels like LT side of face is tight and throat is itchy; NAD in triage; had Benadryl last night, none today

## 2022-08-20 NOTE — Discharge Instructions (Signed)
You are seen in the emergency department for an allergic reaction.  You were given prednisone, loratadine, famotidine to aid with this reaction which appeared to help her symptoms.  I encouraged her to follow-up with a primary care provider for further evaluation and potentially have allergic testing performed to determine what he may be allergic to as we are not sure at this time what you had a reaction to.  If you begin to experience any significant chest pain, shortness of breath, or difficulty swallowing please return to the emergency department immediately.

## 2022-08-20 NOTE — ED Provider Notes (Signed)
Sean Boyer Provider Note   CSN: [id number] Arrival date & time: 08/20/22  1214     History Chief Complaint  Patient presents with   Allergic Reaction    Sean Boyer is a 27 y.o. male.  Patient presented to the emergency department following an allergic reaction.  He reports that he began having symptoms last night after he ate some food but he does not have any known allergies to any foods.  Patient reports he took a Benadryl last night because his lips were swelling and feels that he is still having some irritation in his lips as well as an itchy and scratchy throat.  He that his face is somewhat swollen as well but is not taking anything since the Benadryl last night.  Patient does not have an EpiPen as he has never had any anaphylactic reaction previously.  Denies any chest pain, shortness of breath, difficulty breathing.   Allergic Reaction      Home Medications Prior to Admission medications   Medication Sig Start Date End Date Taking? Authorizing Provider  amoxicillin-clavulanate (AUGMENTIN) 875-125 MG tablet Take 1 tablet by mouth every 12 (twelve) hours. 06/18/18   Harrie Foreman, MD  anti-nausea (EMETROL) solution Take 10 mLs by mouth every 15 (fifteen) minutes as needed for nausea or vomiting.    [provider]  fexofenadine-pseudoephedrine (ALLEGRA-D 24) 180-240 MG 24 hr tablet Take 1 tablet by mouth daily.    [provider]  HYDROcodone-acetaminophen (NORCO/VICODIN) 5-325 MG tablet Take 1 tablet by mouth every 4 (four) hours as needed. 02/05/20   Henderly, Britni A, PA-C  loperamide (IMODIUM) 2 MG capsule Take 1 capsule (2 mg total) by mouth as needed for diarrhea or loose stools. 12/01/15   Horton, Barbette Hair, MD  ondansetron (ZOFRAN ODT) 4 MG disintegrating tablet Take 1 tablet (4 mg total) by mouth every 8 (eight) hours as needed for nausea or vomiting. 12/01/15   Horton, Barbette Hair, MD      Allergies     Dm-doxylamine-acetaminophen and Olanzapine    Review of Systems   Review of Systems  HENT:  Positive for facial swelling.   All other systems reviewed and are negative.   Physical Exam Updated Vital Signs BP 116/80 (BP Location: Right Arm)   Pulse 69   Temp 98.2 F (36.8 C) (Oral)   Resp 17   Ht 5\' 6"  (1.676 m)   Wt 63.5 kg   SpO2 100%   BMI 22.60 kg/m  Physical Exam Vitals and nursing note reviewed.  Constitutional:      General: He is not in acute distress.    Appearance: He is well-developed.  HENT:     Head: Normocephalic and atraumatic.     Comments: Difficult to appreciate any obvious swelling in the face. Eyes:     Conjunctiva/sclera: Conjunctivae normal.  Cardiovascular:     Rate and Rhythm: Normal rate and regular rhythm.     Heart sounds: No murmur heard. Pulmonary:     Effort: Pulmonary effort is normal. No respiratory distress.     Breath sounds: Normal breath sounds.  Abdominal:     Palpations: Abdomen is soft.     Tenderness: There is no abdominal tenderness.  Musculoskeletal:        General: No swelling.     Cervical back: Neck supple.  Skin:    General: Skin is warm and dry.     Capillary Refill: Capillary refill takes less than  2 seconds.     Comments: Slight urticarial rash noted over the right shoulder  Neurological:     Mental Status: He is alert.  Psychiatric:        Mood and Affect: Mood normal.     ED Results / Procedures / Treatments   Labs (all labs ordered are listed, but only abnormal results are displayed) Labs Reviewed - No data to display  EKG None  Radiology No results found.  Procedures Procedures   Medications Ordered in ED Medications  famotidine (PEPCID) tablet 40 mg (40 mg Oral Given 08/20/22 1311)  loratadine (CLARITIN) tablet 10 mg (10 mg Oral Given 08/20/22 1311)  predniSONE (DELTASONE) tablet 60 mg (60 mg Oral Given 08/20/22 1311)    ED Course/ Medical Decision Making/ A&P                            Medical Decision Making Risk OTC drugs.   This patient presents to the ED for concern of allergic reaction. Differential diagnosis includes anaphylaxis, contact irritant, seasonal allergies   Medicines ordered and prescription drug management:  I ordered medication including famotidine, Claritin, prednisone for allergic reaction Reevaluation of the patient after these medicines showed that the patient improved I have reviewed the patients home medicines and have made adjustments as needed   Problem List / ED Course:  Patient presented to the emergency department complaints of an allergic reaction that occurred yesterday.  He reports that he began to have lip swelling after eating dinner.  He does not know of any obvious foods that he is allergic to.  He reports that he took Benadryl last night without any significant improvement in symptoms in his mouth and like he has a itchy throat and his face and head is swollen somewhat.  On my examination patient does not appear to be obviously swollen anywhere but I am not sure patient's baseline appears to be.  Doses of famotidine, Claritin, prednisone were administered here in the emergency department for patient.  He tolerated the medications well and appeared to have symptomatic improvement.  Advised patient to follow-up with his primary care provider and possible allergy specialist for further evaluation and allergy testing to determine what may be the cause of his allergic reaction he appears to have had last night.  Given that allergic presentation did not appear to be anaphylactic in nature, do not believe that there is any need for any epinephrine to be dosed here in the emergency department. Patient agreeable with treatment plan and verbalized understanding all return precautions. All questions answered prior to patient discharge.  Final Clinical Impression(s) / ED Diagnoses Final diagnoses:  Allergic reaction, initial encounter    Rx / DC  Orders ED Discharge Orders     None         Luvenia Heller, PA-C 08/20/22 1544    Fredia Sorrow, MD 08/21/22 [phone]

## 2022-11-09 IMAGING — CR DG SHOULDER 2+V*R*
3 series · 3 of 3 positions shown · non-contrast
Comparison: None Available.

CLINICAL DATA: Transient right shoulder dislocation

EXAM:
RIGHT SHOULDER - 2+ VIEW

[w shoulder external right (1 of 2)]
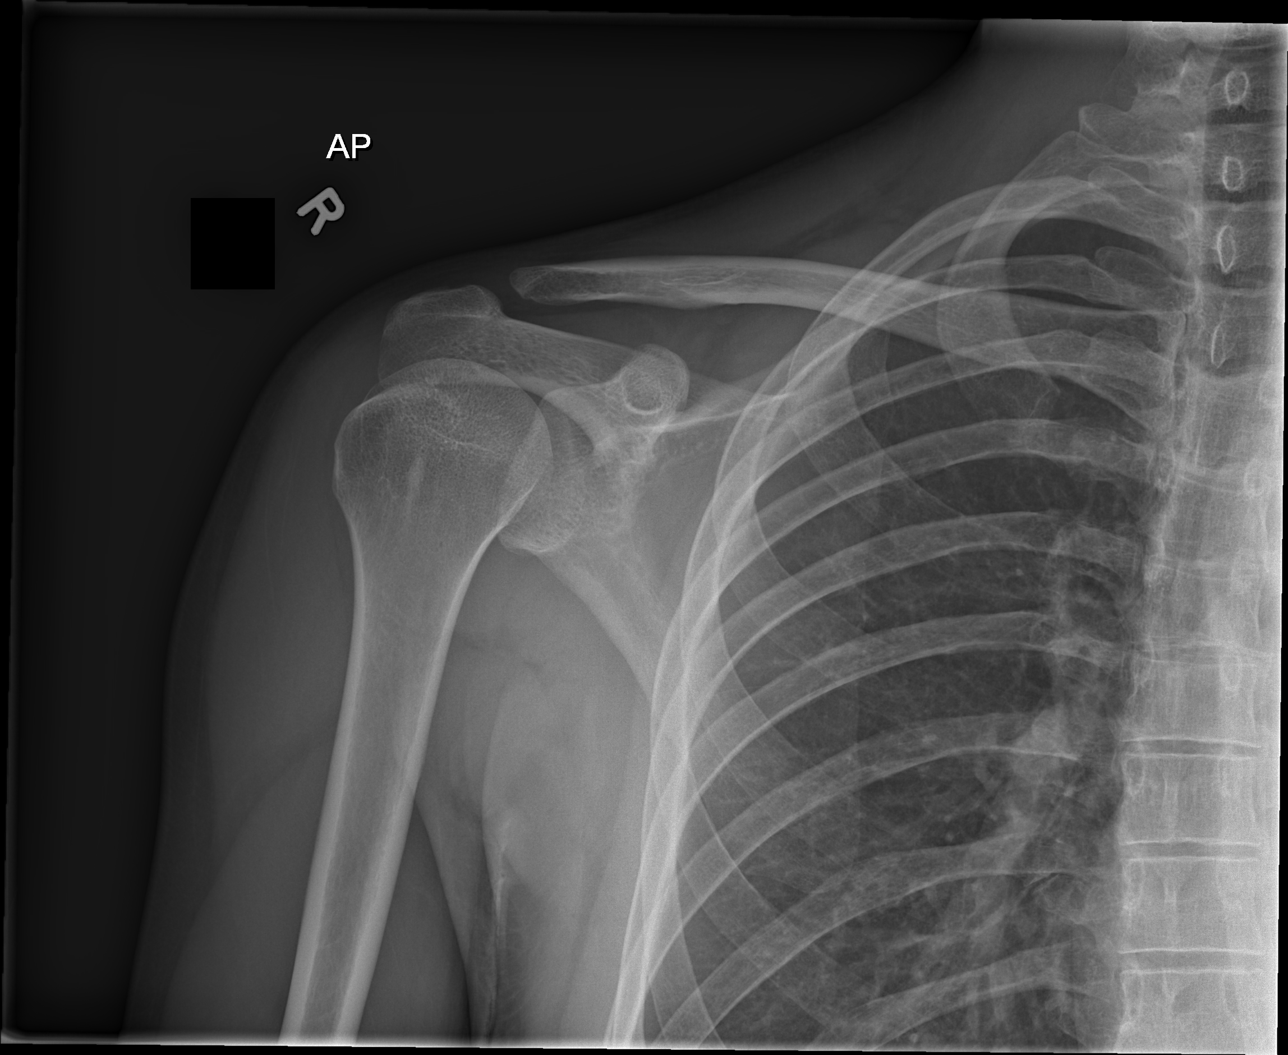

[w shoulder external right (2 of 2)]
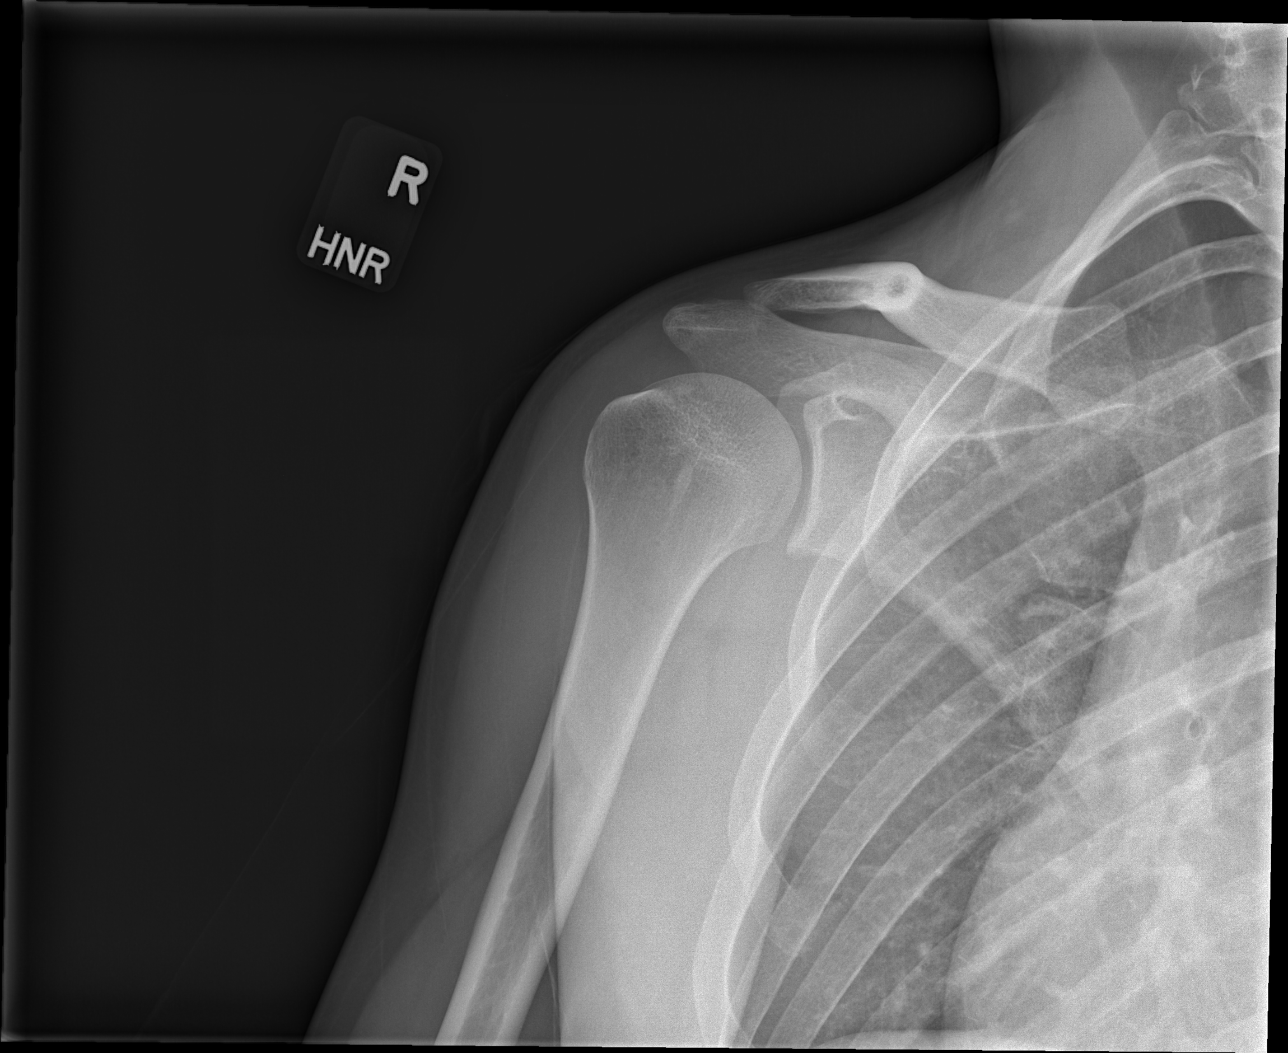

[w shoulder y-view right]
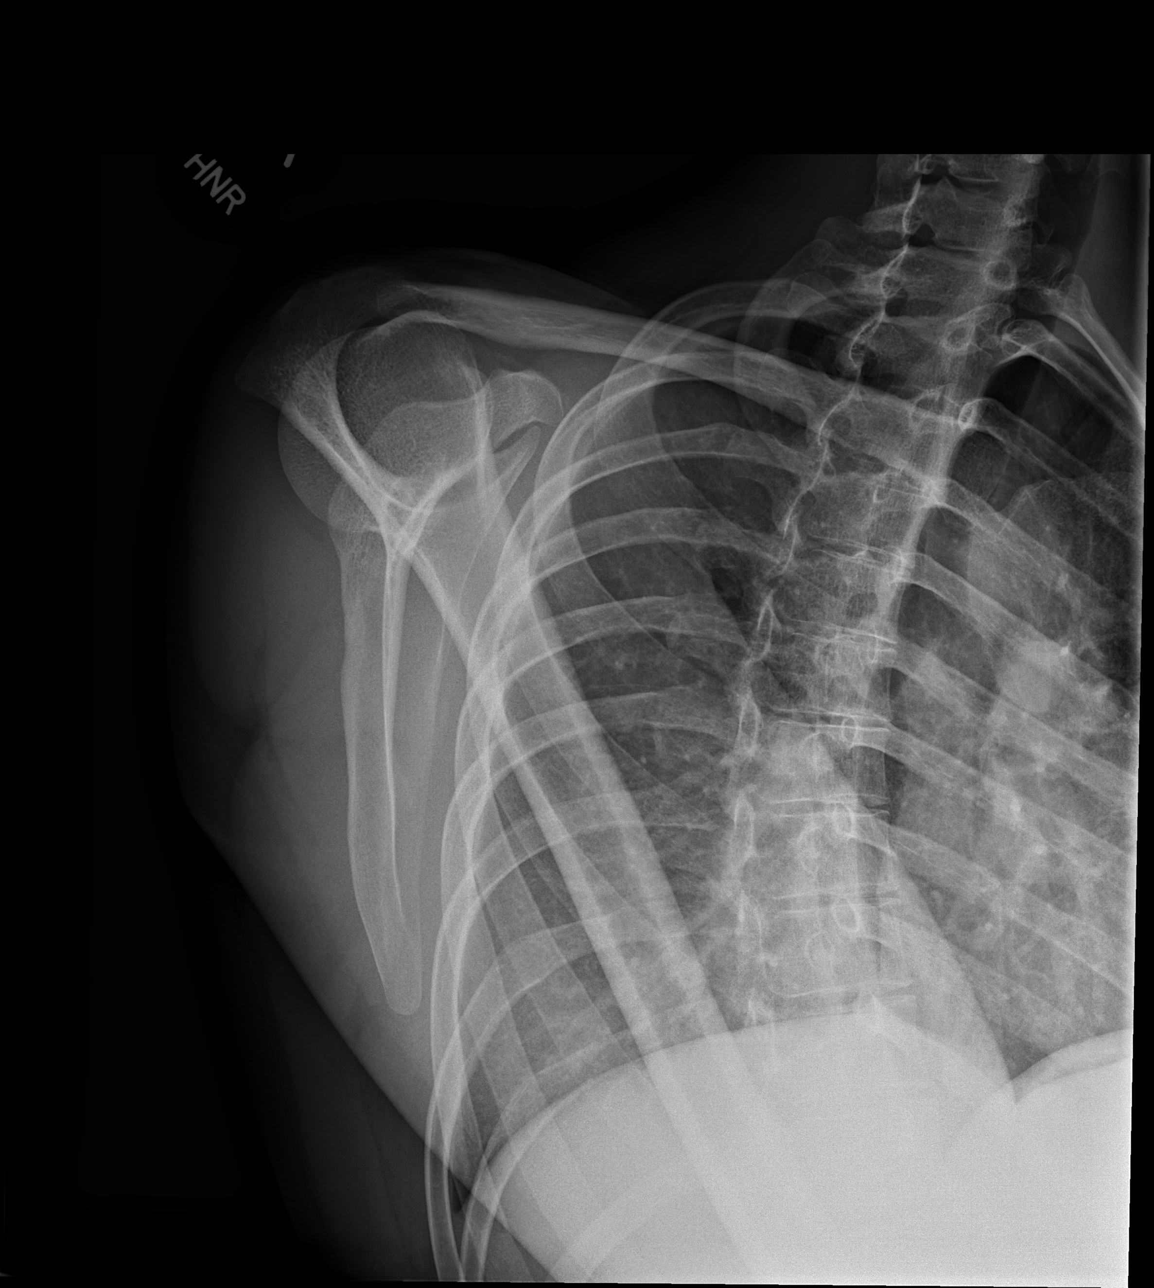

[3 of 3 positions shown; findings below may reference images not displayed]

FINDINGS: Internal rotation, external rotation, and transscapular views are
obtained. No acute displaced fracture, subluxation, or dislocation.
Joint spaces are well preserved. Right chest is clear.
IMPRESSION: 1. Unremarkable right shoulder.

## 2023-12-11 ENCOUNTER — Emergency Department (HOSPITAL_BASED_OUTPATIENT_CLINIC_OR_DEPARTMENT_OTHER)

## 2023-12-11 ENCOUNTER — Encounter (HOSPITAL_BASED_OUTPATIENT_CLINIC_OR_DEPARTMENT_OTHER): Payer: Self-pay

## 2023-12-11 ENCOUNTER — Other Ambulatory Visit: Payer: Self-pay

## 2023-12-11 ENCOUNTER — Emergency Department (HOSPITAL_BASED_OUTPATIENT_CLINIC_OR_DEPARTMENT_OTHER)
Admission: EM | Admit: 2023-12-11 | Discharge: 2023-12-12 | Disposition: A | Discharge status: Home / Self Care | Attending: Emergency Medicine | Admitting: Emergency Medicine

## 2023-12-11 DIAGNOSIS — S92502A Displaced unspecified fracture of left lesser toe(s), initial encounter for closed fracture: Secondary | ICD-10-CM

## 2023-12-11 DIAGNOSIS — S99922A Unspecified injury of left foot, initial encounter: Secondary | ICD-10-CM | POA: Diagnosis present

## 2023-12-11 DIAGNOSIS — M7989 Other specified soft tissue disorders: Secondary | ICD-10-CM | POA: Insufficient documentation

## 2023-12-11 DIAGNOSIS — W010XXA Fall on same level from slipping, tripping and stumbling without subsequent striking against object, initial encounter: Secondary | ICD-10-CM | POA: Insufficient documentation

## 2023-12-11 DIAGNOSIS — S92532A Displaced fracture of distal phalanx of left lesser toe(s), initial encounter for closed fracture: Secondary | ICD-10-CM | POA: Diagnosis not present

## 2023-12-11 NOTE — ED Triage Notes (Addendum)
 Pt presents with an injury to his 4th and 5th digit on his L foot. He reports stepping on a toy car and now he is unable to move his 5th digit.

## 2023-12-12 NOTE — ED Provider Notes (Signed)
 [facility] Provider Note   CSN: [id number] Arrival date & time: 12/11/23  2221     Patient presents with: Toe Injury   Sean Boyer is a 28 y.o. male.   The history is provided by the patient.  He injured his left fifth toe when he stepped on a toy car and slipped.  He denies other injury.   Prior to Admission medications   Medication Sig Start Date End Date Taking? Authorizing Provider  amoxicillin -clavulanate (AUGMENTIN ) 875-125 MG tablet Take 1 tablet by mouth every 12 (twelve) hours. 06/18/18   Stephania Ozell RAMAN, MD  anti-nausea (EMETROL) solution Take 10 mLs by mouth every 15 (fifteen) minutes as needed for nausea or vomiting.    [provider]  EPINEPHrine  0.3 mg/0.3 mL IJ SOAJ injection Inject 0.3 mg into the muscle as needed for anaphylaxis. 08/20/22   Zelaya, Oscar A, PA-C  fexofenadine-pseudoephedrine (ALLEGRA-D 24) 180-240 MG 24 hr tablet Take 1 tablet by mouth daily.    [provider]  HYDROcodone -acetaminophen  (NORCO/VICODIN) 5-325 MG tablet Take 1 tablet by mouth every 4 (four) hours as needed. 02/05/20   Henderly, Britni A, PA-C  loperamide  (IMODIUM ) 2 MG capsule Take 1 capsule (2 mg total) by mouth as needed for diarrhea or loose stools. 12/01/15   Horton, Charmaine FALCON, MD  ondansetron  (ZOFRAN  ODT) 4 MG disintegrating tablet Take 1 tablet (4 mg total) by mouth every 8 (eight) hours as needed for nausea or vomiting. 12/01/15   Horton, Charmaine FALCON, MD    Allergies: Dm-doxylamine-acetaminophen  and Olanzapine    Review of Systems  All other systems reviewed and are negative.   Updated Vital Signs BP 127/89 (BP Location: Right Arm)   Pulse 74   Temp 98.6 F (37 C) (Oral)   Resp 20   Ht 5' 6 (1.676 m)   Wt 63.5 kg   SpO2 97%   BMI 22.60 kg/m   Physical Exam Vitals and nursing note reviewed.   28 year old male, resting comfortably and in no acute distress. Vital signs are normal. Oxygen saturation is  97%, which is normal. Head is normocephalic and atraumatic. PERRLA, EOMI.  Lungs are clear without rales, wheezes, or rhonchi. Heart has regular rate and rhythm without murmur. Extremities: There is mild swelling of the left fifth toe with point tenderness at the PIP joint.  There is no deformity.  Distal sensation and capillary refill are intact. Skin is warm and dry without rash. Neurologic: Awake and alert, moves all extremities equally.   Radiology: DG Foot Complete Left Result Date: 12/11/2023 CLINICAL DATA:  Status post trauma to the fourth and fifth left toes. EXAM: LEFT FOOT - COMPLETE 3+ VIEW COMPARISON:  None Available. FINDINGS: A small nondisplaced fracture deformity is seen involving the distal aspect of the proximal phalanx of the fifth left toe. There is no evidence of dislocation. There is no evidence of arthropathy. An 11 mm bone island is suspected within the left calcaneus. Soft tissues are unremarkable. IMPRESSION: Small nondisplaced fracture of the proximal phalanx of the fifth left toe. Electronically Signed   By: Suzen Dials M.D.   On: 12/11/2023 23:03     .Ortho Injury Treatment  Date/Time: 12/12/2023 12:26 AM  Performed by: Raford Lenis, MD Authorized by: Raford Lenis, MD   Consent:    Consent obtained:  Verbal   Consent given by:  Patient   Risks discussed:  Restricted joint movement and stiffness   Alternatives discussed:  No treatmentInjury location: toe Location details: left fifth toe Injury type: fracture Fracture type: proximal phalanx Pre-procedure neurovascular assessment: neurovascularly intact Pre-procedure distal perfusion: normal Pre-procedure neurological function: normal Pre-procedure range of motion: reduced  Anesthesia: Local anesthesia used: no  Patient sedated: NoManipulation performed: no Immobilization: Buddy taping. Supplies used: cotton padding (Tape) Post-procedure neurovascular assessment: post-procedure neurovascularly  intact Post-procedure distal perfusion: normal Post-procedure neurological function: normal Post-procedure range of motion: unchanged      Medications Ordered in the ED - No data to display                                  Medical Decision Making Amount and/or Complexity of Data Reviewed Radiology: ordered.   Injury to left fifth toe.  X-rays show fracture of the proximal phalanx.  Have independently viewed the images, and agree with the radiologist's interpretation.  I have ordered buddy taping of the toe and I am discharging him with instructions to apply ice, buddy tape the toe as needed, use over-the-counter NSAIDs and acetaminophen  as needed.     Final diagnoses:  Closed fracture of phalanx of left fifth toe, initial encounter    ED Discharge Orders     None          Raford Lenis, MD 12/12/23 [phone]

## 2023-12-12 NOTE — ED Notes (Signed)
 L 4th and 5th toes buddy taped per order at this time.  Patient teaching completed.

## 2023-12-12 NOTE — Discharge Instructions (Addendum)
 Apply ice for 30 minutes at a time, 4 times a day.  Buddy tape the toes as needed.  You will probably need to do this for about 3-4 weeks.  You may take ibuprofen  or naproxen as needed for pain.  For additional pain relief, add acetaminophen .  When you combine acetaminophen  with ibuprofen  or naproxen, you get better pain relief and you get from taking either medication by itself.
